# Patient Record
Sex: Female | Born: 1985 | Race: Black or African American | Hispanic: No | Marital: Single | Smoking: Former smoker
Health system: Southern US, Community
[De-identification: ages and names within clinical notes are randomized; demographics above are authoritative.]

## PROBLEM LIST (undated history)

## (undated) DIAGNOSIS — Z8709 Personal history of other diseases of the respiratory system: Secondary | ICD-10-CM

## (undated) HISTORY — DX: Personal history of other diseases of the respiratory system: Z87.09

---
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
## 2006-05-01 ENCOUNTER — Emergency Department (HOSPITAL_COMMUNITY): Admission: EM | Admit: 2006-05-01 | Discharge: 2006-05-01 | Payer: Self-pay | Admitting: Family Medicine

## 2006-05-04 ENCOUNTER — Emergency Department (HOSPITAL_COMMUNITY): Admission: EM | Admit: 2006-05-04 | Discharge: 2006-05-04 | Payer: Self-pay | Admitting: Emergency Medicine

## 2006-05-21 ENCOUNTER — Emergency Department (HOSPITAL_COMMUNITY): Admission: EM | Admit: 2006-05-21 | Discharge: 2006-05-21 | Payer: Self-pay | Admitting: Family Medicine

## 2007-04-09 ENCOUNTER — Emergency Department (HOSPITAL_COMMUNITY): Admission: EM | Admit: 2007-04-09 | Discharge: 2007-04-09 | Payer: Self-pay | Admitting: Emergency Medicine

## 2007-07-09 ENCOUNTER — Emergency Department (HOSPITAL_COMMUNITY): Admission: EM | Admit: 2007-07-09 | Discharge: 2007-07-09 | Payer: Self-pay | Admitting: Emergency Medicine

## 2009-06-28 ENCOUNTER — Inpatient Hospital Stay: Payer: Self-pay | Admitting: General Surgery

## 2010-07-09 ENCOUNTER — Encounter: Admission: RE | Admit: 2010-07-09 | Discharge: 2010-07-09 | Payer: Self-pay | Admitting: Family Medicine

## 2011-04-25 NOTE — Discharge Summary (Signed)
[place]. [facility]  Patient:    Kristen Galvan, Kristen Galvan                   MRN: [id number] Adm. Date:  03/24/00 Disc. Date: 03/27/00 Attending:  Alleen Borne, M.D. Dictator:   Sherrie George, P.A. CC:         Lorelle Formosa, M.D.                           Discharge Summary  DATE OF BIRTH:  [birth year].  ADMISSION DIAGNOSES: 1. Spontaneous left pneumothorax. 2. Pneumomediastinum.  DISCHARGE DIAGNOSES: 1. Spontaneous left pneumothorax. 2. Pneumomediastinum.  PROCEDURES:   Insertion of left chest tube 03/24/00, Dr. Laneta Simmers.  BRIEF HISTORY:  Patient is a 25 year old black female who has otherwise been in good health.  She developed some shortness of breath and pain in the central chest and into the throat.  The family brought her to the emergency room and a chest x-ray was obtained and showed a left pneumothorax and pneumomediastinum with a slight shift of the heart to the right.  Breath sounds were diminished on the right and there was some bilateral wheezing.  A 20-French chest tube was inserted without difficulty and the patient was admitted for observation and further treatment as indicated.  ALLERGIES:  None known.  CURRENT MEDICATIONS:  None.  PAST MEDICAL HISTORY:  Significant for asthma which she has treated p.r.n. with inhalers ______ .  Her last use was approximately a year ago, she has had no other surgical or medical history.  For further history and physical, please see the dictated note.  HOSPITAL COURSE:  After chest tube insertion, she was transferred to the pediatric floor.  She was seen by pediatric service, who has followed her throughout her hospital course.  On April 17, she looked more comfortable after the chest tube was in, there was no leak ______ subcutaneous air. Repeat chest x-ray on 4/18 showed a loculated anterior pneumothorax on the left.  It was smaller than yesterday, but the chest tube was left in place  at waterseal in order to allow more opportunity to drain.  By March 26, 2000, there was still no air leak, there was significant change in the chest x-ray it might have been somewhat smaller in the chest, the pneumomediastinum might have been somewhat smaller.  The chest tube was subsequently removed.  On March 27, 2000, her chest x-ray showed the lung to be inflated, everything looked good and it was Dr. Garen Grams opinion that she could be discharged home.  DISCHARGE MEDICATIONS:  Darvocet-N 100 one to two p.o. q.4h. p.r.n.  FOLLOWUP:  She will return on Tuesday, May 1 with a chest x-ray at the [facility] and see Dr. Laneta Simmers at 9:45 a.m.  The patient is instructed to call if she has any problems, to clean her wounds with plenty of soap and water.  The family was also instructed that this could occur again on either side and if she had any further symptoms like this to come back to the emergency room and obtain a chest x-ray.  LABORATORY DATA:  None.  DISCHARGE CONDITION:  Improving. DD:  03/27/00 TD:  03/28/00 Job: [id number]

## 2011-04-25 NOTE — H&P (Signed)
[place]. [facility]  Patient:    Kristen Galvan, Kristen Galvan                  MRN: [id number] Adm. Date:  03/24/00 Attending:  Alleen Borne, M.D. CC:         Alleen Borne, M.D.                         History and Physical  DATE OF BIRTH:  Aug 08, 1986  CHIEF COMPLAINT:  Shortness of breath.  HISTORY OF PRESENT ILLNESS:  This is a 25 year old female with asthma who began developing upper respiratory symptoms on March 23, 2000 and then overnight developed shortness of breath, wheezing, and pain in lateral chest up into the throat.  She presented to the emergency room and there chest x-rays were done which showed left pneumothorax and pneumomediastinum with slight shift of the heart to the right.  On examination she had decreased breath sounds on the left with wheezing bilaterally.  After consent was received a 24 inch left chest tube was inserted without difficulty by Dr. Laneta Simmers.  Follow-up x-ray revealed a resolution of the left pneumothorax but there was the persistence of a pneumomediastinum.  Patient was subsequently admitted to the hospital and pediatric consult was obtained.  PAST MEDICAL HISTORY:  Significant for asthma for which she is treated with p.r.n. inhalers in third and fifth grade.  Her last use was approximately a year ago.  She had no other surgical or medical history.  MEDICATIONS:  None.  ALLERGIES:  No known drug allergies.  SOCIAL HISTORY:  She has one older sibling.  She lives with her parents.  She is currently in the eighth grade.  FAMILY HISTORY:  Negative for asthma.  PHYSICAL EXAMINATION:  GENERAL:  Reveals a well-appearing 25 year old female resting comfortably in no acute distress.  A&O x 3.  Judgment, insight appropriate for age.  VITAL SIGNS:  Temperature 99.9.  Heart rate 14.  Respiratory rate 45.  Blood pressure 126/68.  Saturation 99% on room air.  HEENT:  , AT, EOMI, PERRL.  Oropharynx is clear.  TMs are  clear.  Tonsils are benign.  NECK:  Supple without JVD, lymphadenopathy, thyromegaly.  No carotid bruits are noted.  Trachea is in the midline.  No crepitus noted.  CHEST:  Symmetrical inspiration.  There is noted scattered wheezes throughout with increased extension noted.  There is no nasal flaring but there is mild retraction noted.  There is chest tube placement on the left side of the chest.  CARDIOVASCULAR:  Regular rate and rhythm without murmur, rub, or gallop.  PMI is nondisplaced.  No ______.  ABDOMEN:  Soft.  Bowel sounds present in four quadrants.  Nontender.  No palpable pulses, ______.  No bruits.  GENITOURINARY:  Deferred.  RECTAL:  Deferred.  EXTREMITIES:  Without clubbing, cyanosis, or edema.  Peripheral pulses intact.  NEUROLOGIC:  CN II-XII grossly intact without focal deficits.  IMPRESSION: 1. Spontaneous pneumothorax. 2. Pneumomediastinum.  PLAN:  As noted, patient is admitted and will be treated and pediatric consult gotten. DD:  03/24/00 TD:  03/24/00 Job: [id number]

## 2011-09-22 LAB — WET PREP, GENITAL: Yeast Wet Prep HPF POC: NONE SEEN

## 2011-09-22 LAB — GC/CHLAMYDIA PROBE AMP, GENITAL
Chlamydia, DNA Probe: NEGATIVE
GC Probe Amp, Genital: NEGATIVE

## 2011-09-22 LAB — POCT PREGNANCY, URINE
Operator id: 247071
Preg Test, Ur: NEGATIVE

## 2011-09-22 LAB — POCT URINALYSIS DIP (DEVICE)
Bilirubin Urine: NEGATIVE
Glucose, UA: NEGATIVE
Hgb urine dipstick: NEGATIVE
Operator id: 247071
Specific Gravity, Urine: 1.02

## 2011-12-09 HISTORY — PX: APPENDECTOMY: SHX54

## 2012-06-25 ENCOUNTER — Ambulatory Visit: Payer: Self-pay | Admitting: Emergency Medicine

## 2013-03-23 ENCOUNTER — Ambulatory Visit: Payer: Self-pay | Admitting: Family Medicine

## 2013-11-13 ENCOUNTER — Observation Stay: Payer: Self-pay

## 2013-11-14 ENCOUNTER — Inpatient Hospital Stay: Payer: Self-pay

## 2013-11-14 LAB — CBC WITH DIFFERENTIAL/PLATELET
Basophil %: 0.4 %
Eosinophil #: 0.1 10*3/uL (ref 0.0–0.7)
HCT: 36.6 % (ref 35.0–47.0)
HGB: 12.3 g/dL (ref 12.0–16.0)
Lymphocyte #: 1.7 10*3/uL (ref 1.0–3.6)
MCH: 30.5 pg (ref 26.0–34.0)
MCHC: 33.7 g/dL (ref 32.0–36.0)
Monocyte #: 0.8 x10 3/mm (ref 0.2–0.9)
Neutrophil #: 7.9 10*3/uL — ABNORMAL HIGH (ref 1.4–6.5)
RDW: 15 % — ABNORMAL HIGH (ref 11.5–14.5)
WBC: 10.5 10*3/uL (ref 3.6–11.0)

## 2014-03-13 ENCOUNTER — Ambulatory Visit: Payer: Self-pay | Admitting: Physician Assistant

## 2014-03-19 LAB — WOUND CULTURE

## 2015-03-30 NOTE — Op Note (Signed)
PATIENT NAME:  Kristen Galvan, Kristen Galvan MR#:  [id number] DATE OF BIRTH:  [birth year]  DATE OF PROCEDURE:  11/14/2013  PREOPERATIVE DIAGNOSES: 1. Intrauterine pregnancy at 37 weeks, 4 days gestational age.  2. Spontaneous labor.  3. Fetal intolerance of labor.   POSTOPERATIVE DIAGNOSES: 1. Intrauterine pregnancy at 37 weeks, 4 days gestational age.  2. Spontaneous labor.  3. Fetal intolerance of labor.   PROCEDURE: Primary low transverse cesarean section via Pfannenstiel incision.   ANESTHESIA: Epidural.   SURGEON: Thomasene MohairStephen Zyion Leidner, M.D.   ASSISTANT: Jill Sideolleen L. Sharen HonesGutierrez, CNM   ESTIMATED BLOOD LOSS: 1000 mL.  OPERATIVE FLUIDS: 1000 mL crystalloid.   COMPLICATIONS: None.   FINDINGS: 1. Gravid uterus with posterior left lower uterine segment fibroid (approximately 5 cm).  2. Gravid appearing fallopian tubes and ovaries.   SPECIMENS: None.   CONDITION AT THE END OF PROCEDURE: Stable.   INDICATIONS FOR PROCEDURE: The patient is a 29 year old gravida 1, para 0 at 937 weeks 4 days  gestational age who arrived in labor and delivery in spontaneous labor. She progressed to approximately 8 cm when she began having late decelerations. This was after augmentation with a minimal amount of Pitocin. Once the augmentation was stopped she continued to have spontaneous contractions; however, with each contraction she would have a late deceleration. She was given terbutaline to see if this would affect decelerations, which with the cessation of contractions it did. The fetal heart rate was also tachycardic to 165 to 170 with minimal variability. Given her lack of response to other in utero resuscitative measures including positional changes, administration of oxygen, as well as a bolus of IV fluid and amnioinfusion. These measures were undertaken without relief of variable decelerations and fetal distress, she was therefore taken to the Operating Room for abdominal delivery.   PROCEDURE IN DETAIL: The  patient was taken to the Operating Room where epidural anesthesia was administered and found to be adequate. She was prepped and draped in the usual sterile fashion after being positioned in the dorsal supine position with a rightward tilt. After timeout was called, a Pfannenstiel incision was made with a scalpel and carried through the various layers until the peritoneum was identified and entered sharply. After extending the peritoneal opening, a bladder flap was created and a bladder retractor was used to move the bladder out of operative area of interest. A low transverse hysterotomy was made with a scalpel and extended laterally with cranial and caudal tension. The fetal vertex was grasped, elevated to the hysterotomy and delivered with fundal pressure. The head followed by the shoulders and the rest of the body were delivered without difficulty. The cord was clamped and cut and the infant was handed to the pediatrician. Cord blood was collected. The placenta was removed and the uterus was exteriorized and cleared of all clots and debris. The hysterotomy was closed using #0 Vicryl in a running locked fashion. A second layer of the same suture was used to obtain hemostasis. Uterus was returned to the abdomen and gutters were cleared of all clots and debris. The peritoneum was closed using #0 Vicryl in a running fashion.   The On-Q catheters were placed according to the manufacturer's recommendations. They were placed approximately 4 cm cephalad to the incision line, inserted to a depth of approximately the fourth marking on the catheters, positioned just superficial to the rectus abdominis muscles and just deep to the rectus fascia. They were located about 1 cm apart straddling the midline. Each catheter was bolused with  5 mL of 0.5% Marcaine plain after skin closure. This was for a total of 10 mL of 0.5% Marcaine plain.   The fascia was reapproximated using #0 Maxon using 2 stitches, each starting at the  lateral apex and meeting in the midline where they were tied together. Skin was closed using subcuticular stapler and this closure was reinforced using benzoin as well as Steri-Strips.   The On-Q catheters were affixed to the skin using Dermabond as well as Steri-Strips and Tegaderm.   The patient tolerated the procedure well. Sponge, lap, and needle counts were correct x 2. The patient was wearing pneumatic compression stockings throughout the entire procedure. She was given 1 gram of Ancef for antibiotic prophylaxis prior to skin incision.   ____________________________ Conard Novak, MD sdj:sg D: 11/14/2013 13:45:31 ET T: 11/14/2013 14:19:41 ET JOB#: [id number]  cc: Conard Novak, MD, <Dictator> Conard Novak MD ELECTRONICALLY SIGNED 12/06/2013 7:54

## 2015-04-17 NOTE — H&P (Signed)
L&D Evaluation:  History Expanded:  HPI 29 yo G2 P0010, EDD of 12/01/13 per LMP and 13 wk US. Presents at 37 4/7 wks with c/o increased ctx. Pt was evaluated yesterday for same, dilated 1 cm with ctx q 10 min, she was given Morphine for therapeutic rest before d/c home. C/o LOF after admission with negative nitrizine. No VB or decrease FM. [facility], tx care from [facility] at 13 wks, elevated 1 hr with normal 3 hr. 5 cm posterior fibroid seen at 20 weeks.   Blood Type (Maternal) O positive   Group B Strep Results Maternal (Result >5wks must be treated as unknown) negative   Maternal HIV Negative   Maternal Syphilis Ab Nonreactive   Maternal Varicella Immune   Rubella Results (Maternal) immune   Maternal T-Dap Immune   Presents with contractions   Patient's Medical History spontaneous pneumothorax at age 29   Patient's Surgical History Appendectomy   Medications Pre [place]atal Vitamins   Allergies PCN   Social History none   ROS:  ROS see HPI   Exam:  Vital Signs stable  initial temp 99.1, now 98.6   General no apparent distress   Mental Status clear   Chest clear   Heart no murmur/gallop/rubs   Abdomen gravid, tender with contractions   Estimated Fetal Weight 6.5 lbs per [place]   Fetal Position vertex   Edema no edema   Pelvic 4.5 per RN   Mebranes questionable SROM - negative nitrizine   FHT baseline 150-160, min-mod variability, small accelerations   Ucx regular, q 9-10 min   Impression:  Impression active labor   Plan:  Comments Admission for labor Requesting epidural   Electronic Signatures: Uzair Godley, Marta Lamasamara K (CNM)  (Signed 08-Dec-14 06:31)  Authored: L&D Evaluation   Last Updated: 08-Dec-14 06:31 by Vella KohlerBrothers, Mackenzee Becvar K (CNM)

## 2015-04-17 NOTE — H&P (Signed)
L&D Evaluation:  History Expanded:  HPI 29 yo G2 P0010, EDD of 12/01/13 per LMP and 13 wk US. Presents with c/o ctx since last night, about every 10 minutes. Denies LOF, VB or decrease FM. [facility], tx care from [facility] at 13 wks, elevated 1 hr with normal 3 hr.   Blood Type (Maternal) O positive   Group B Strep Results Maternal (Result >5wks must be treated as unknown) negative   Maternal HIV Negative   Maternal Syphilis Ab Nonreactive   Maternal Varicella Immune   Rubella Results (Maternal) immune   Patient's Medical History spontaneous pneumothorax at age 29   Patient's Surgical History Appendectomy   Medications Pre [place]atal Vitamins   Allergies PCN   Social History none   ROS:  ROS see HPI   Exam:  Vital Signs stable   General no apparent distress   Mental Status clear   Chest no increased work of breathing   Abdomen gravid, tender with contractions   Fetal Position vertex   Edema no edema   Pelvic 1/90/-2, BBOW   Mebranes Intact   FHT normal rate with no decels, initial FHR with decreased variability, became category 1 with po hydration and food intake (pt had not eaten before coming in this am)   Ucx regular, q 9-10 min   Impression:  Impression early labor   Plan:  Comments Discussed early labor with pt and family. Offered therapeutic rest which pt agrees to - Morphine 4 mg IM.   Discharge home with term labor precautions.   Follow Up Appointment already scheduled. 12/8   Electronic Signatures: Marta AntuBrothers, Gladyes Kudo K (CNM)  (Signed 07-Dec-14 12:00)  Authored: L&D Evaluation   Last Updated: 07-Dec-14 12:00 by Vella KohlerBrothers, Payam Gribble K (CNM)

## 2016-01-23 ENCOUNTER — Other Ambulatory Visit: Payer: Self-pay | Admitting: Nurse Practitioner

## 2016-01-23 DIAGNOSIS — N63 Unspecified lump in unspecified breast: Secondary | ICD-10-CM

## 2016-02-01 ENCOUNTER — Ambulatory Visit
Admission: RE | Admit: 2016-02-01 | Discharge: 2016-02-01 | Disposition: A | Discharge status: Home / Self Care | Payer: Medicaid Other | Source: Ambulatory Visit | Attending: Nurse Practitioner | Admitting: Nurse Practitioner

## 2016-02-01 ENCOUNTER — Other Ambulatory Visit: Payer: Self-pay | Admitting: Nurse Practitioner

## 2016-02-01 DIAGNOSIS — N63 Unspecified lump in unspecified breast: Secondary | ICD-10-CM

## 2016-02-04 ENCOUNTER — Other Ambulatory Visit: Payer: Self-pay | Admitting: Nurse Practitioner

## 2016-02-04 DIAGNOSIS — N63 Unspecified lump in unspecified breast: Secondary | ICD-10-CM

## 2016-02-07 ENCOUNTER — Ambulatory Visit (INDEPENDENT_AMBULATORY_CARE_PROVIDER_SITE_OTHER): Payer: Medicaid Other | Admitting: Family Medicine

## 2016-02-07 ENCOUNTER — Encounter: Payer: Self-pay | Admitting: Family Medicine

## 2016-02-07 VITALS — BP 105/69 | HR 85 | Ht 66.0 in | Wt 122.6 lb

## 2016-02-07 DIAGNOSIS — B349 Viral infection, unspecified: Secondary | ICD-10-CM | POA: Diagnosis not present

## 2016-02-07 MED ORDER — NAPROXEN 500 MG PO TABS: 500.0000 mg | ORAL_TABLET | Freq: Two times a day (BID) | ORAL | Status: DC

## 2016-02-07 NOTE — Progress Notes (Signed)
Date:  02/07/2016   Name:  Kristen Galvan   DOB:  1986-09-19   MRN:  [id number]  PCP:  No PCP Per Patient    Chief Complaint: New Evaluation and Back Pain   History of Present Illness:  This is a 30 y.o. female with 2d hx headache, upper and lower non-radiating back pain, subjective fever and chills. No URI or abdominal sxs. Ibuprofen helps but wears off. Otherwise healthy.  Review of Systems:  Review of Systems  HENT: Negative for ear pain, rhinorrhea and sore throat.   Respiratory: Negative for cough.   Genitourinary: Negative for dysuria.  Neurological: Negative for dizziness and light-headedness.    There are no active problems to display for this patient.   Prior to Admission medications   Medication Sig Start Date End Date Taking? Authorizing Provider  naproxen (NAPROSYN) 500 MG tablet Take 1 tablet (500 mg total) by mouth 2 (two) times daily with a meal. 02/07/16   Schuyler Amor, MD    No Known Allergies  Past Surgical History  Procedure Laterality Date  . Appendectomy  2013  . Lung surgery Left 1999    Social History  Substance Use Topics  . Smoking status: Never Smoker   . Smokeless tobacco: None  . Alcohol Use: No    Family History  Problem Relation Age of Onset  . Diabetes Father     Medication list has been reviewed and updated.  Physical Examination: BP 105/69 mmHg  Pulse 85  Ht  (1.676 m)  Wt 122 lb 9.6 oz (55.611 kg)  BMI 19.80 kg/m2  LMP 01/02/2016  Physical Exam  Constitutional: She is oriented to person, place, and time. She appears well-developed and well-nourished.  HENT:  Head: Normocephalic and atraumatic.  Right Ear: External ear normal.  Left Ear: External ear normal.  Nose: Nose normal.  Mouth/Throat: Oropharynx is clear and moist.  TM's clear  Eyes: Conjunctivae and EOM are normal. Pupils are equal, round, and reactive to light. No scleral icterus.  Neck: Neck supple. No thyromegaly present.  Cardiovascular:  Normal rate, regular rhythm and normal heart sounds.   Pulmonary/Chest: Effort normal and breath sounds normal.  Abdominal: Soft. She exhibits no distension and no mass. There is no tenderness.  Musculoskeletal: She exhibits no edema.  Mild diffuse parathoracic and paralumbar tenderness  Lymphadenopathy:    She has no cervical adenopathy.  Neurological: She is alert and oriented to person, place, and time. Coordination normal.  Skin: Skin is warm and dry.  Psychiatric: She has a normal mood and affect. Her behavior is normal.  Nursing note and vitals reviewed.   Assessment and Plan:  1. Viral syndrome Naprosyn 500 mg bid x 1 week  Return if symptoms worsen or fail to improve.  Dionne Ano. Kingsley Spittle MD [facility]  02/07/2016

## 2016-02-13 ENCOUNTER — Ambulatory Visit
Admission: RE | Admit: 2016-02-13 | Discharge: 2016-02-13 | Disposition: A | Discharge status: Home / Self Care | Payer: Medicaid Other | Source: Ambulatory Visit | Attending: Nurse Practitioner | Admitting: Nurse Practitioner

## 2016-02-13 DIAGNOSIS — N6009 Solitary cyst of unspecified breast: Secondary | ICD-10-CM | POA: Diagnosis not present

## 2016-02-13 DIAGNOSIS — N63 Unspecified lump in unspecified breast: Secondary | ICD-10-CM

## 2016-02-13 HISTORY — PX: BREAST BIOPSY: SHX20

## 2016-02-15 LAB — SURGICAL PATHOLOGY

## 2016-02-16 ENCOUNTER — Encounter: Payer: Self-pay | Admitting: Emergency Medicine

## 2016-02-16 ENCOUNTER — Emergency Department: Payer: Medicaid Other

## 2016-02-16 ENCOUNTER — Emergency Department
Admission: EM | Admit: 2016-02-16 | Discharge: 2016-02-17 | Disposition: A | Discharge status: Home / Self Care | Payer: Medicaid Other | Attending: Emergency Medicine | Admitting: Emergency Medicine

## 2016-02-16 DIAGNOSIS — Z791 Long term (current) use of non-steroidal anti-inflammatories (NSAID): Secondary | ICD-10-CM | POA: Diagnosis not present

## 2016-02-16 DIAGNOSIS — J45909 Unspecified asthma, uncomplicated: Secondary | ICD-10-CM | POA: Diagnosis not present

## 2016-02-16 DIAGNOSIS — J069 Acute upper respiratory infection, unspecified: Secondary | ICD-10-CM | POA: Insufficient documentation

## 2016-02-16 DIAGNOSIS — Z3202 Encounter for pregnancy test, result negative: Secondary | ICD-10-CM | POA: Diagnosis not present

## 2016-02-16 DIAGNOSIS — B349 Viral infection, unspecified: Secondary | ICD-10-CM

## 2016-02-16 DIAGNOSIS — M545 Low back pain, unspecified: Secondary | ICD-10-CM

## 2016-02-16 LAB — CBC
HEMATOCRIT: 40 % (ref 35.0–47.0)
HEMOGLOBIN: 13.3 g/dL (ref 12.0–16.0)
MCH: 29.2 pg (ref 26.0–34.0)
MCHC: 33.3 g/dL (ref 32.0–36.0)
MCV: 87.9 fL (ref 80.0–100.0)
Platelets: 176 10*3/uL (ref 150–440)
RBC: 4.56 MIL/uL (ref 3.80–5.20)
RDW: 13.3 % (ref 11.5–14.5)
WBC: 7.2 10*3/uL (ref 3.6–11.0)

## 2016-02-16 LAB — COMPREHENSIVE METABOLIC PANEL
ALK PHOS: 60 U/L (ref 38–126)
ALT: 12 U/L — AB (ref 14–54)
ANION GAP: 3 — AB (ref 5–15)
AST: 18 U/L (ref 15–41)
Albumin: 4.4 g/dL (ref 3.5–5.0)
BUN: 13 mg/dL (ref 6–20)
CALCIUM: 8.6 mg/dL — AB (ref 8.9–10.3)
CHLORIDE: 107 mmol/L (ref 101–111)
CO2: 22 mmol/L (ref 22–32)
CREATININE: 0.74 mg/dL (ref 0.44–1.00)
Glucose, Bld: 106 mg/dL — ABNORMAL HIGH (ref 65–99)
Potassium: 3.8 mmol/L (ref 3.5–5.1)
Sodium: 132 mmol/L — ABNORMAL LOW (ref 135–145)
Total Bilirubin: 0.9 mg/dL (ref 0.3–1.2)
Total Protein: 7.6 g/dL (ref 6.5–8.1)

## 2016-02-16 LAB — URINALYSIS COMPLETE WITH MICROSCOPIC (ARMC ONLY)
Bilirubin Urine: NEGATIVE
Glucose, UA: NEGATIVE mg/dL
HGB URINE DIPSTICK: NEGATIVE
NITRITE: NEGATIVE
PROTEIN: NEGATIVE mg/dL
SPECIFIC GRAVITY, URINE: 1.024 (ref 1.005–1.030)
pH: 5 (ref 5.0–8.0)

## 2016-02-16 LAB — RAPID INFLUENZA A&B ANTIGENS: Influenza A (ARMC): NEGATIVE

## 2016-02-16 LAB — SEDIMENTATION RATE: Sed Rate: 6 mm/hr (ref 0–20)

## 2016-02-16 LAB — RAPID INFLUENZA A&B ANTIGENS (ARMC ONLY): INFLUENZA B (ARMC): NEGATIVE

## 2016-02-16 LAB — POCT PREGNANCY, URINE: Preg Test, Ur: NEGATIVE

## 2016-02-16 MED ORDER — HYDROCODONE-ACETAMINOPHEN 5-325 MG PO TABS
1.0000 | ORAL_TABLET | ORAL | Status: AC
  Administered 2016-02-16: 1 via ORAL
  Filled 2016-02-16: qty 1

## 2016-02-16 MED ORDER — HYDROCODONE-ACETAMINOPHEN 5-325 MG PO TABS: 1.0000 | ORAL_TABLET | Freq: Four times a day (QID) | ORAL | Status: DC | PRN

## 2016-02-16 MED ORDER — IBUPROFEN 600 MG PO TABS
600.0000 mg | ORAL_TABLET | ORAL | Status: AC
  Administered 2016-02-16: 600 mg via ORAL
  Filled 2016-02-16: qty 1

## 2016-02-16 MED ORDER — ACETAMINOPHEN 500 MG PO TABS
1000.0000 mg | ORAL_TABLET | Freq: Once | ORAL | Status: AC
  Administered 2016-02-16: 1000 mg via ORAL
  Filled 2016-02-16: qty 2

## 2016-02-16 NOTE — ED Provider Notes (Signed)
[facility] Emergency Department Provider Note  ____________________________________________  Time seen: Approximately 9:45 PM  I have reviewed the triage vital signs and the nursing notes.   HISTORY  Chief Complaint Back Pain    HPI Kristen Galvan is a 30 y.o. female reports for about the last week she's been having achy pain in her lower back, she also started developing a nonproductive cough about 3 days ago, a runny nose, and feels as though all of her muscles are aching. She denies neck pain or headache.  She does report that her lower back feels very achy. She denies pregnancy. No abdominal pain nausea or vomiting. She is taking naproxen but stopped taking this about 2 days ago. She was seen by her primary care doctor who placed her on naproxen for muscle aches and lower back pain a few days ago.  She denies shortness of breath or wheezing. She does have a history of asthma but denies active symptoms. Nonproductive cough. She did not receive an influenza shot.  No numbness tingling or weakness in the legs. No history of IV drug abuse, cancer.  Patient does report she had an appendectomy, pneumothorax at age 68, and had a breast biopsy just a few days ago over the left breast but denies pain redness or concerning the surgery.  Past Medical History  Diagnosis Date  . History of pneumothorax 1999    There are no active problems to display for this patient.   Past Surgical History  Procedure Laterality Date  . Appendectomy  2013  . Lung surgery Left 1999  . Breast biopsy Left 02/13/2016    path pending    Current Outpatient Rx  Name  Route  Sig  Dispense  Refill  . HYDROcodone-acetaminophen (NORCO/VICODIN) 5-325 MG tablet   Oral   Take 1 tablet by mouth every 6 (six) hours as needed for moderate pain.   15 tablet   0   . naproxen (NAPROSYN) 500 MG tablet   Oral   Take 1 tablet (500 mg total) by mouth 2 (two) times daily with a meal.    14 tablet   0     Allergies Review of patient's allergies indicates no known allergies.  Family History  Problem Relation Age of Onset  . Diabetes Father     Social History Social History  Substance Use Topics  . Smoking status: Never Smoker   . Smokeless tobacco: None  . Alcohol Use: No    Review of Systems Constitutional: Fever and chills  Eyes: No visual changes. ENT: No sore throat. Runny nose Cardiovascular: Denies chest pain. Respiratory: Denies shortness of breath. Slight cough nonproductive. Gastrointestinal: No abdominal pain.  No nausea, no vomiting.  No diarrhea.  No constipation. Genitourinary: Negative for dysuria. No bowel or bladder incontinence. Musculoskeletal: Achy pain across the muscles of her lower back for about the last week. Skin: Negative for rash. Neurological: Negative for headaches, focal weakness or numbness.  10-point ROS otherwise negative.  ____________________________________________   PHYSICAL EXAM:  VITAL SIGNS: ED Triage Vitals  Enc Vitals Group     BP 02/16/16 1900 115/75 mmHg     Pulse Rate 02/16/16 1900 107     Resp 02/16/16 1900 20     Temp 02/16/16 1900 102.5 F (39.2 C)     Temp Source 02/16/16 1900 Oral     SpO2 02/16/16 1900 96 %     Weight 02/16/16 1900 122 lb (55.339 kg)     Height  02/16/16 1900 '5\' 6"'  (1.676 m)     Head Cir --      Peak Flow --      Pain Score 02/16/16 1902 8     Pain Loc --      Pain Edu? --      Excl. in [place]? --    Constitutional: Alert and oriented. Well appearing and in no acute distress. Eyes: Conjunctivae are normal. PERRL. EOMI. Head: Atraumatic.No photophobia. Nose: No congestion/rhinnorhea. Mouth/Throat: Mucous membranes are moist.  Oropharynx non-erythematous. Neck: No stridor.  No meningismus. Cardiovascular: Normal rate, regular rhythm. Grossly normal heart sounds.  Good peripheral circulation. Left breast with a small pinhole size incision that appears to be healing well, no  erythema tenderness redness or swelling noted in the region. Respiratory: Normal respiratory effort.  No retractions. Lungs CTAB. No wheezing. Speaks in full normal sentences with no distress. Gastrointestinal: Soft and nontender. No distention.  No CVA tenderness. Musculoskeletal: No cervical or thoracic tenderness. Patient does report mild bilateral discomfort to palpation of the paraspinous muscles of the lumbar spine. She does also report some mild tenderness over the mid lumbar spine to palpation. She reports it feels "achy". There is no deformity. No lower extremity tenderness nor edema.  No joint effusions. Normal patellar reflexes bilaterally. 5 out of 5 strength in all major muscle groups of the legs with normal gait. No loss of sensation over the legs or thighs. Neurologic:  Normal speech and language. No gross focal neurologic deficits are appreciated. No gait instability. Skin:  Skin is warm, dry and intact. No rash noted. Psychiatric: Mood and affect are normal. Speech and behavior are normal.  ____________________________________________   LABS (all labs ordered are listed, but only abnormal results are displayed)  Labs Reviewed  URINALYSIS COMPLETEWITH MICROSCOPIC ([facility] ONLY) - Abnormal; Notable for the following:    Color, Urine YELLOW (*)    APPearance HAZY (*)    Ketones, ur 1+ (*)    Leukocytes, UA TRACE (*)    Bacteria, UA RARE (*)    Squamous Epithelial / LPF 6-30 (*)    All other components within normal limits  COMPREHENSIVE METABOLIC PANEL - Abnormal; Notable for the following:    Sodium 132 (*)    Glucose, Bld 106 (*)    Calcium 8.6 (*)    ALT 12 (*)    Anion gap 3 (*)    All other components within normal limits  RAPID INFLUENZA A&B ANTIGENS ([facility] ONLY)  CULTURE, BLOOD (ROUTINE X 2)  CULTURE, BLOOD (ROUTINE X 2)  CBC  SEDIMENTATION RATE  INFLUENZA PANEL BY PCR (TYPE A & B, H1N1)  POCT PREGNANCY, URINE    ____________________________________________  EKG   ____________________________________________  RADIOLOGY   DG Chest 2 View (Final result) Result time: 02/16/16 22:00:58   Final result by Rad Results In Interface (02/16/16 22:00:58)   Narrative:   CLINICAL DATA: Cough for 1 week. Fever.  EXAM: CHEST 2 VIEW  COMPARISON: 03/23/2013  FINDINGS: The cardiomediastinal contours are normal. The lungs are clear. Pulmonary vasculature is normal. No consolidation, pleural effusion, or pneumothorax. No acute osseous abnormalities are seen.  IMPRESSION: Normal chest radiographs.   Electronically Signed By: Jeb Levering M.D. On: 02/16/2016 22:00    ____________________________________________   PROCEDURES  Procedure(s) performed: None  Critical Care performed: No  ____________________________________________   INITIAL IMPRESSION / ASSESSMENT AND PLAN / ED COURSE  Pertinent labs & imaging results that were available during my care of the patient were reviewed by  me and considered in my medical decision making (see chart for details).  Patient presents for evaluation of fever, developing a recent nonproductive cough, runny nose for the last 2-3 days with associated muscle aches and lower back pain. Overall her symptoms seemingly most suggestive of a mild viral illness, influenza definitely considered. However, given her back pain was when the initiating symptoms and a fever to 102 I will consider but find it unlikely this would represent epidural abscess. I will send an ESR which is fairly sensitive to screen for this and the relatively low risk patient with no history of IV drug abuse, or immunosuppression. No trauma.  Should her influenza test be positive if things would likely explain most of her symptoms. Urinalysis does not demonstrate clear infection she denies urinary symptoms. No associated chest pain, no red flag neurologic symptoms to go with back  pain such as incontinence, loss of sensation or weakness to lower extremities. Nothing to suggest acute cauda equina.  ----------------------------------------- 10:56 PM on 02/16/2016 -----------------------------------------  Patient influenza test is negative. The patient's symptoms seem to be probably related to something such as a viral type illness, I am concerned that her back pain started first. She does endorse moderate lower back pain in the region of her mid to lower lumbar spine. Given her associated fever, and consideration of possible myelitis, epidural abscess, etc. I have ordered an MRI of the lumbar spine. She does not exhibit any symptoms such as headache, neck stiffness or pain, photophobia or other concerns for meningitis. I believe that if her MRI of the lower back is negative, likely discharge her consideration for probable viral etiology.  Patient awake and alert. Ongoing care and follow-up on MRI of the lumbar spine and PCR flu assigned to Dr. Edd Fabian. ____________________________________________   FINAL CLINICAL IMPRESSION(S) / ED DIAGNOSES  Final diagnoses:  Low back pain  Upper respiratory, viral syndrome  Above diagnoses are provisional, MRI lumbar spine pending.    Delman Kitten, MD 02/16/16 [phone]

## 2016-02-16 NOTE — Discharge Instructions (Signed)
You have been seen in the Emergency Department (ED) today for a likely viral illness.  Please drink plenty of clear fluids (water, Gatorade, chicken broth, etc).  You may use Tylenol and/or Motrin according to label instructions.  You can alternate between the two without any side effects.   Please follow up with your doctor as listed above.  Call your doctor or return to the Emergency Department (ED) if you are unable to tolerate fluids due to vomiting, have worsening trouble breathing, become extremely tired or difficult to awaken, or if you develop any other symptoms that concern you.  ALSO,  You have been seen in the Emergency Department (ED)  today for back pain.  Your workup and exam have not shown any acute abnormalities and you are likely suffering from muscle strain or possible problems with your discs, but there is no treatment that will fix your symptoms at this time.  Please take Motrin (ibuprofen) as needed for your pain according to the instructions written on the box.  Alternatively, for the next five days you can take 600mg  three times daily with meals (it may upset your stomach).  Take hydorocodone as prescribed for severe pain. Do not drink alcohol, drive or participate in any other potentially dangerous activities while taking this medication as it may make you sleepy. Do not take this medication with any other sedating medications, either prescription or over-the-counter. If you were prescribed Percocet or Vicodin, do not take these with acetaminophen (Tylenol) as it is already contained within these medications.   This medication is an opiate (or narcotic) pain medication and can be habit forming.  Use it as little as possible to achieve adequate pain control.  Do not use or use it with extreme caution if you have a history of opiate abuse or dependence.  If you are on a pain contract with your primary care doctor or a pain specialist, be sure to let them know you were prescribed  this medication today from the [facility].  This medication is intended for your use only - do not give any to anyone else and keep it in a secure place where nobody else, especially children, have access to it.  It will also cause or worsen constipation, so you may want to consider taking an over-the-counter stool softener while you are taking this medication.  Please follow up with your doctor as soon as possible regarding today's ED visit and your back pain.  Return to the ED for worsening back pain, fever, weakness or numbness of either leg, or if you develop either (1) an inability to urinate or have bowel movements, or (2) loss of your ability to control your bathroom functions (if you start having "accidents"), or if you develop other new symptoms that concern you.

## 2016-02-16 NOTE — ED Notes (Signed)
Low back pain x 1 week. Denies injury.

## 2016-02-17 LAB — INFLUENZA PANEL BY PCR (TYPE A & B)
H1N1FLUPCR: NOT DETECTED
INFLBPCR: NEGATIVE
Influenza A By PCR: POSITIVE — AB

## 2016-02-17 MED ORDER — GADOBENATE DIMEGLUMINE 529 MG/ML IV SOLN
15.0000 mL | Freq: Once | INTRAVENOUS | Status: AC | PRN
  Administered 2016-02-17: 11 mL via INTRAVENOUS

## 2016-02-17 NOTE — ED Provider Notes (Signed)
-----------------------------------------   12:22 AM on 02/17/2016 ----------------------------------------- Care was assumed from Dr. Fanny BienQuale at approximately 11:30 PM. Briefly this is a 30 year old female presenting with back pain as well as respiratory tract infection symptoms and fever to 102.5. She is awaiting MRI of the lumbar spine. If unremarkable, anticipate discharge home with likely diagnosis of viral syndrome.   ----------------------------------------- 3:48 AM on 02/17/2016 ----------------------------------------- MRI negative. Patient's pain well-controlled at this time and she appears comfortable. Discussed precautions, need for close follow-up and she is comfortable with the discharge plan. DC home.  MRI lumbar spine IMPRESSION: Negative MRI of the lumbar spine with and without contrast.   Kristen DossEryka A Dacey Milberger, MD 02/17/16 [phone]

## 2016-02-18 ENCOUNTER — Encounter: Payer: Self-pay | Admitting: Family Medicine

## 2016-02-18 ENCOUNTER — Ambulatory Visit (INDEPENDENT_AMBULATORY_CARE_PROVIDER_SITE_OTHER): Payer: Medicaid Other | Admitting: Family Medicine

## 2016-02-18 VITALS — BP 122/76 | HR 76 | Ht 66.0 in | Wt 127.2 lb

## 2016-02-18 DIAGNOSIS — M545 Low back pain, unspecified: Secondary | ICD-10-CM

## 2016-02-18 DIAGNOSIS — J101 Influenza due to other identified influenza virus with other respiratory manifestations: Secondary | ICD-10-CM

## 2016-02-18 NOTE — Patient Instructions (Signed)

## 2016-02-19 NOTE — Progress Notes (Signed)
Date:  02/18/2016   Name:  Kristen Galvan   DOB:  [birth year]   MRN:  [id number]  PCP:  No PCP Per Patient    Chief Complaint: Follow-up and Back Pain   History of Present Illness:  This is a 30 y.o. female seen 3/2 for viral syndrome and placed on Naprosyn with some improvement. Developed LBP without radiation 2d ago and seen [facility] ED where MRI negative. CMP/CBC/UA/urine preg negative. One rapid flu recorded as negative but another recorded as positive for flu A. Note indicates had been having cough, rhinorrhea, myalgias for three days. Told to restart Naprosyn. Better today but back still bothering. No flu imm this year.   Review of Systems:  Review of Systems  Constitutional: Negative for fever.  HENT: Negative for sore throat.   Respiratory: Negative for shortness of breath.   Cardiovascular: Negative for chest pain and leg swelling.  Neurological: Negative for syncope and light-headedness.    There are no active problems to display for this patient.   Prior to Admission medications   Medication Sig Start Date End Date Taking? Authorizing Provider  HYDROcodone-acetaminophen (NORCO/VICODIN) 5-325 MG tablet Take 1 tablet by mouth every 6 (six) hours as needed for moderate pain. 02/16/16  Yes Sharyn CreamerMark Quale, MD  naproxen (NAPROSYN) 500 MG tablet Take 1 tablet (500 mg total) by mouth 2 (two) times daily with a meal. 02/07/16  Yes Schuyler AmorWilliam Randie Tallarico, MD    No Known Allergies  Past Surgical History  Procedure Laterality Date  . Appendectomy  2013  . Lung surgery Left 1999  . Breast biopsy Left 02/13/2016    path pending    Social History  Substance Use Topics  . Smoking status: Never Smoker   . Smokeless tobacco: None  . Alcohol Use: No    Family History  Problem Relation Age of Onset  . Diabetes Father     Medication list has been reviewed and updated.  Physical Examination: BP 122/76 mmHg  Pulse 76  Ht 5\' 6"  (1.676 m)  Wt 127 lb 3.2 oz (57.698 kg)  BMI 20.54 kg/m2   LMP 01/02/2016  Physical Exam  Constitutional: She appears well-developed and well-nourished.  HENT:  Mouth/Throat: Oropharynx is clear and moist.  Pulmonary/Chest: Effort normal and breath sounds normal.  Musculoskeletal: She exhibits no edema.  Mild-moderate B paralumbar tenderness  Lymphadenopathy:    She has no cervical adenopathy.  Neurological: She is alert.  Skin: Skin is warm and dry.  Psychiatric: She has a normal mood and affect. Her behavior is normal.  Nursing note and vitals reviewed.   Assessment and Plan:  1. Influenza A Dx unclear given conflicting rapid tests but sxs consistent with influenza, given duration of sxs will not rx with antiviral  2. Bilateral low back pain without sciatica Likely due to viral illness, cont Naprosyn bid   Return if symptoms worsen or fail to improve.  Dionne AnoWilliam M. Kingsley SpittlePlonk, Jr. MD [facility]  02/19/2016

## 2016-02-22 LAB — CULTURE, BLOOD (ROUTINE X 2)
CULTURE: NO GROWTH
CULTURE: NO GROWTH

## 2017-03-18 IMAGING — MR MR LUMBAR SPINE WO/W CM
6 of 7 series · 34 of 48 positions shown · IV contrast (multihance)
Comparison: None.

CLINICAL DATA: Low back pain and LEFT leg pain for 1 week. No
injury. URI symptoms for 3 days. Recent LEFT breast biopsy.

EXAM:
MRI LUMBAR SPINE WITHOUT AND WITH CONTRAST
TECHNIQUE: Multiplanar and multiecho pulse sequences of the lumbar spine were
obtained without and with intravenous contrast.
CONTRAST:  11mL MULTIHANCE GADOBENATE DIMEGLUMINE 529 MG/ML IV SOLN

[Series 3: T2 · sagittal · 4.0mm · 0.81mm/px · 3 of 15 slices shown (1 of 2)]
[im 1/15]
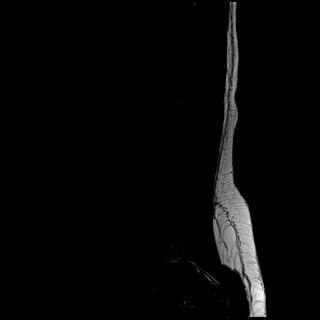
[im 8/15]
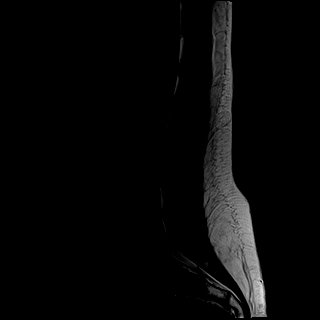
[im 15/15]
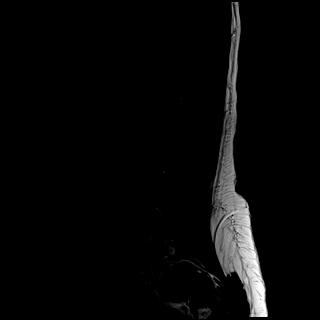

[Series 4: T1 · sagittal · 4.0mm · 0.81mm/px · 4 of 15 slices shown (1 of 2)]
[im 1/15]
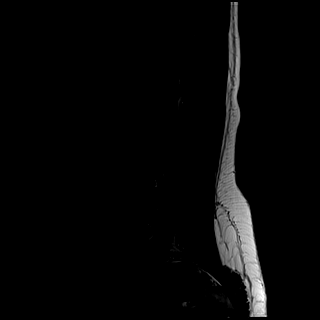
[im 5/15]
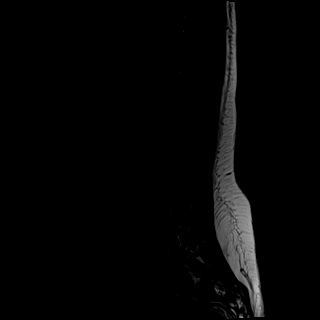
[im 10/15]
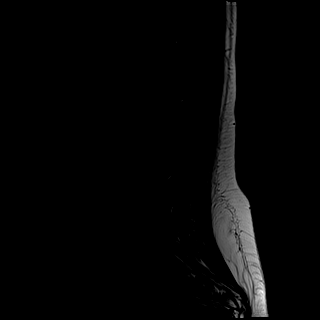
[im 15/15]
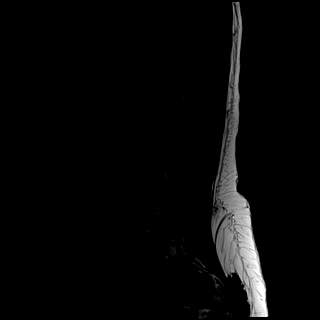

[Series 5: STIR · sagittal · 4.0mm · 1.02mm/px · 1 of 15 slices shown]
[im 1/15]
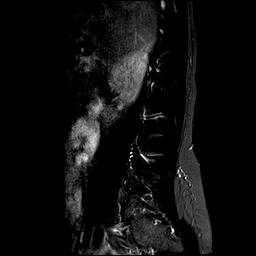

[Series 6: T2 · axial · 4.0mm · 0.78mm/px · z∈[-90,+162]mm · 11 of 43 slices shown (2 of 2)]
[im 1/43]
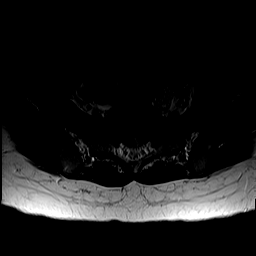
[im 5/43]
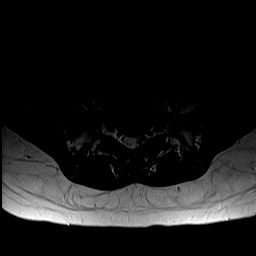
[im 9/43]
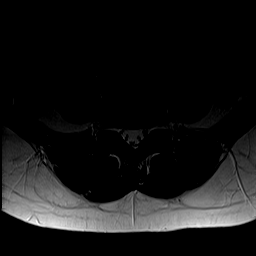
[im 13/43]
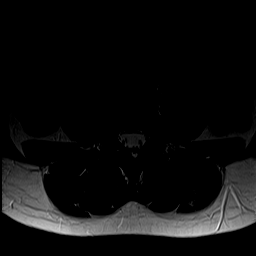
[im 17/43]
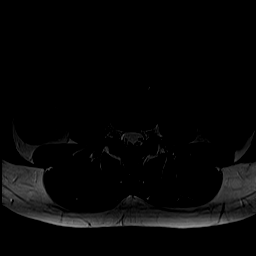
[im 22/43]
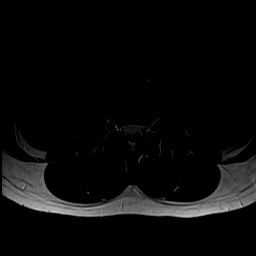
[im 26/43]
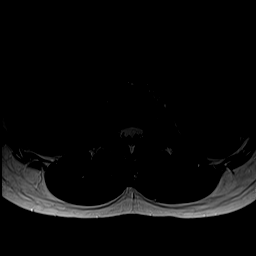
[im 30/43]
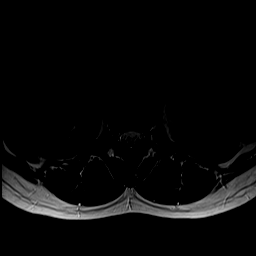
[im 34/43]
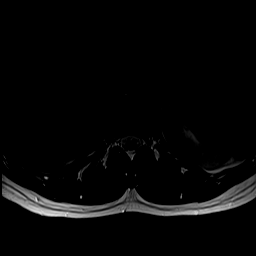
[im 38/43]
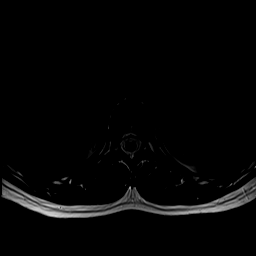
[im 43/43]
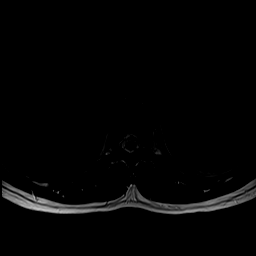

[Series 7: T1 · axial · 4.0mm · 0.39mm/px · z∈[-90,+162]mm · 11 of 43 slices shown (2 of 2)]
[im 1/43]
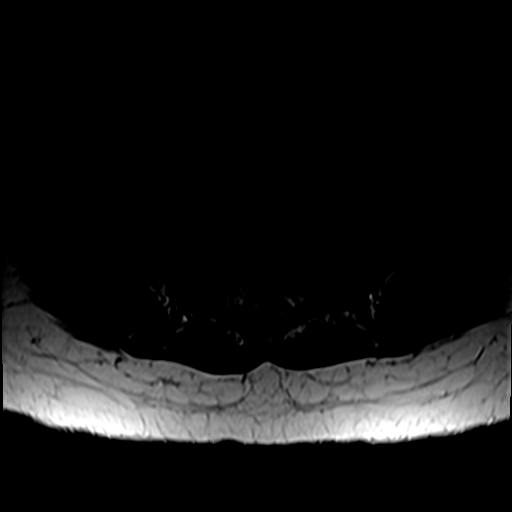
[im 5/43]
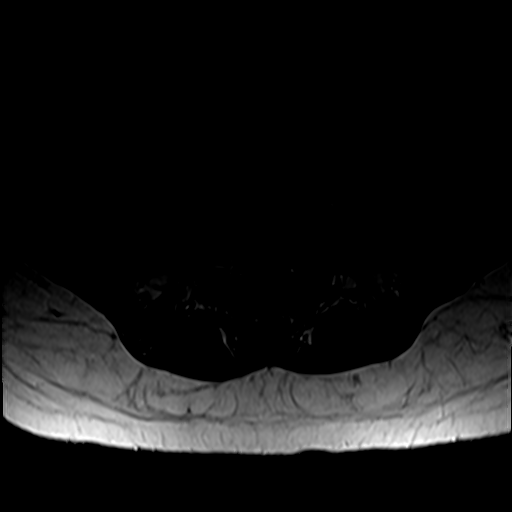
[im 9/43]
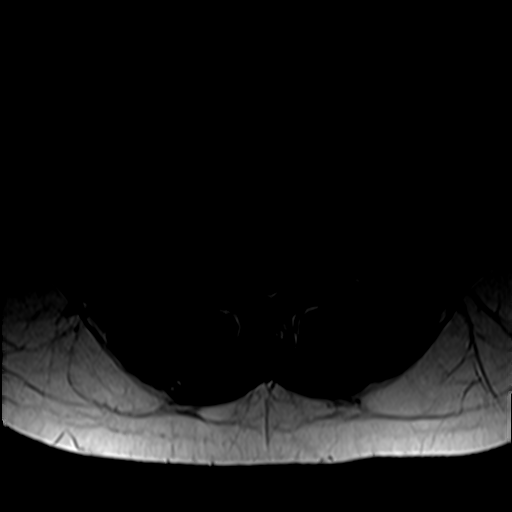
[im 13/43]
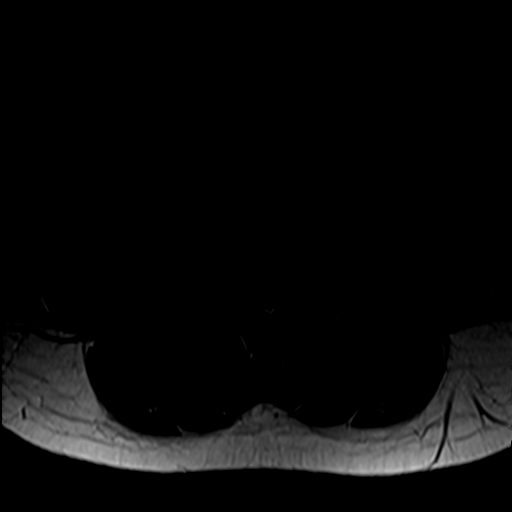
[im 17/43]
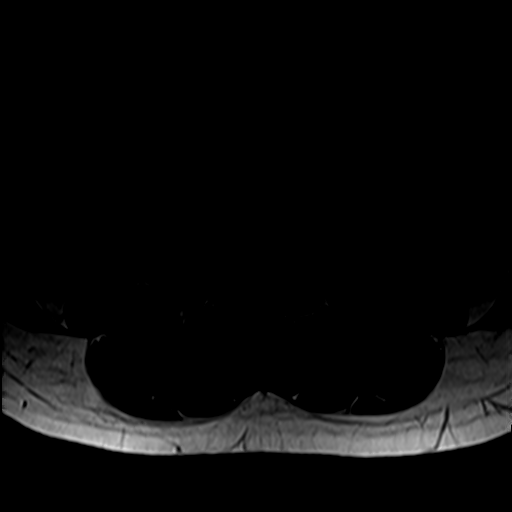
[im 22/43]
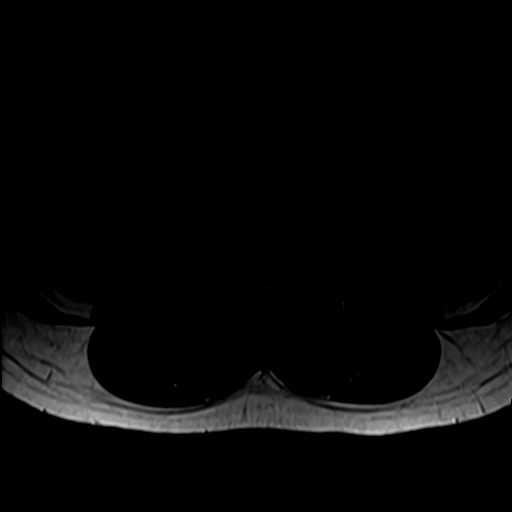
[im 26/43]
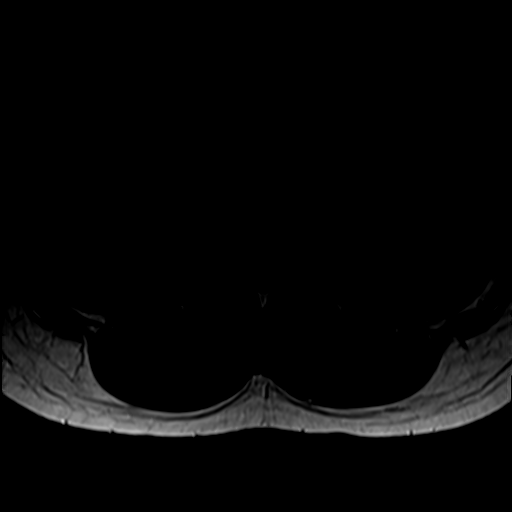
[im 30/43]
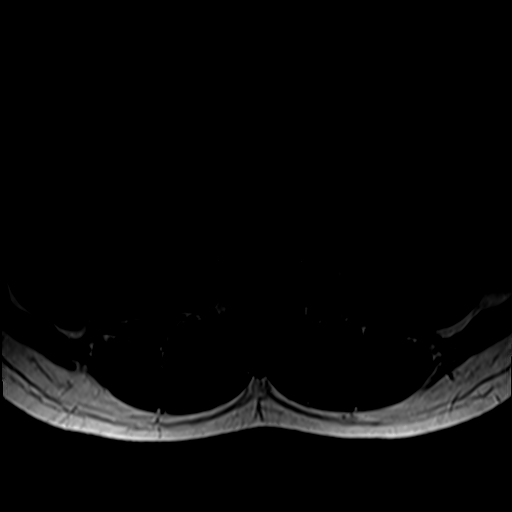
[im 34/43]
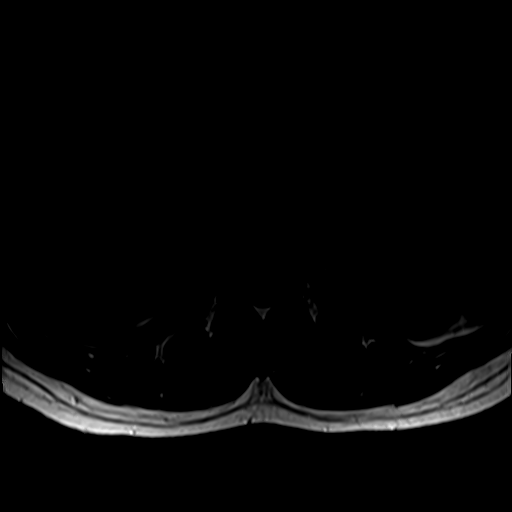
[im 38/43]
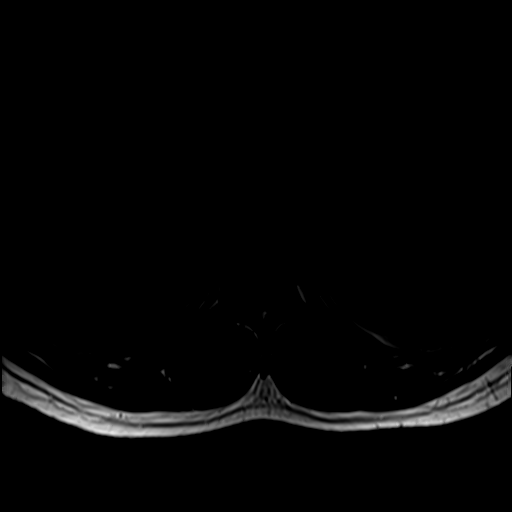
[im 43/43]
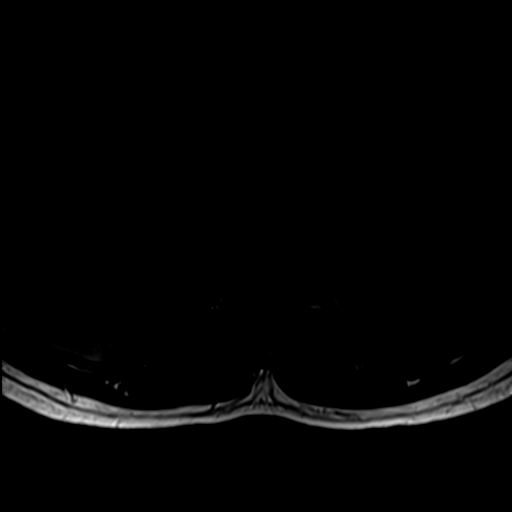

[Series 8: T1 fat-sat post-contrast · sagittal · 4.0mm · 0.81mm/px · 4 of 15 slices shown]
[im 1/15]
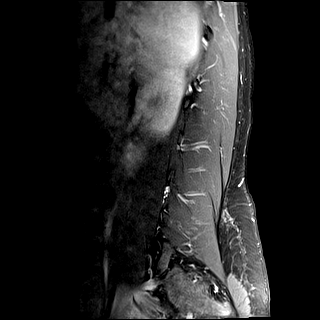
[im 5/15]
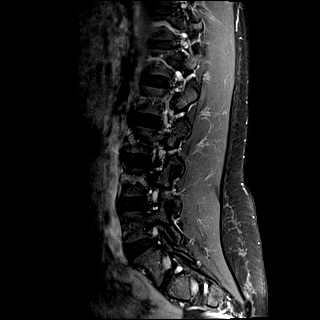
[im 10/15]
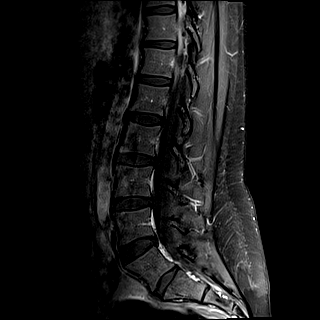
[im 15/15]
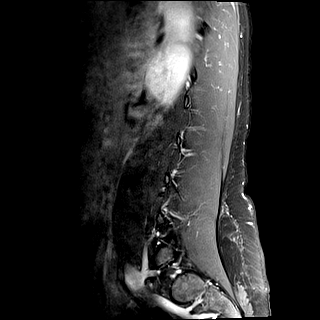

[34 of 48 positions shown; findings below may reference images not displayed]

FINDINGS: Lumbar vertebral bodies and posterior elements are intact and
aligned with maintenance of lumbar lordosis. Using the reference
level of the last well-formed intervertebral disc as L5-S1,
transitional anatomy with sacralized L5 vertebral body, RIGHT L5-S1
pseudoarthrosis. Intervertebral discs demonstrate normal morphology
and signal characteristics. No abnormal bone marrow signal. No
abnormal osseous or intradiscal enhancement.

Conus medullaris terminates at L1 and appears normal in morphology
and signal characteristics. Cauda equina is normal. No abnormal
cord, leptomeningeal or epidural enhancement. Included prevertebral
and paraspinal soft tissues are normal. Included view of the
thoracic spine demonstrate tiny RIGHT T11-12 disc protrusion.

Level by level evaluation:

T12-L1 through L5-S1: No disc bulge, canal stenosis or neural
foraminal narrowing.
IMPRESSION: Negative MRI of the lumbar spine with and without contrast.

## 2017-10-21 ENCOUNTER — Encounter: Payer: Self-pay | Admitting: *Deleted

## 2017-10-21 ENCOUNTER — Ambulatory Visit (INDEPENDENT_AMBULATORY_CARE_PROVIDER_SITE_OTHER): Payer: Self-pay

## 2017-10-21 ENCOUNTER — Ambulatory Visit
Admission: EM | Admit: 2017-10-21 | Discharge: 2017-10-21 | Disposition: A | Discharge status: Home / Self Care | Payer: Self-pay | Attending: Family Medicine | Admitting: Family Medicine

## 2017-10-21 DIAGNOSIS — M25561 Pain in right knee: Secondary | ICD-10-CM

## 2017-10-21 DIAGNOSIS — M79644 Pain in right finger(s): Secondary | ICD-10-CM

## 2017-10-21 DIAGNOSIS — S63501A Unspecified sprain of right wrist, initial encounter: Secondary | ICD-10-CM

## 2017-10-21 DIAGNOSIS — S8391XA Sprain of unspecified site of right knee, initial encounter: Secondary | ICD-10-CM

## 2017-10-21 DIAGNOSIS — M25531 Pain in right wrist: Secondary | ICD-10-CM

## 2017-10-21 DIAGNOSIS — W108XXA Fall (on) (from) other stairs and steps, initial encounter: Secondary | ICD-10-CM

## 2017-10-21 MED ORDER — KETOROLAC TROMETHAMINE 60 MG/2ML IM SOLN
30.0000 mg | Freq: Once | INTRAMUSCULAR | Status: AC
  Administered 2017-10-21: 30 mg via INTRAMUSCULAR

## 2017-10-21 MED ORDER — HYDROCODONE-ACETAMINOPHEN 5-325 MG PO TABS: ORAL_TABLET | ORAL | 0 refills | Status: DC

## 2017-10-21 NOTE — ED Provider Notes (Signed)
[facility]    CSN: [id number] Arrival date & time: 10/21/17  1640     History   Chief Complaint Chief Complaint  Patient presents with  . Wrist Pain    HPI Kristen Galvan is a 31 y.o. female.   31 yo female with a c/o right wrist, thumb and knee pain after falling at home today. States she missed the last step going down her stairs. Denies hitting her head or loss of consciousness.   The history is provided by the patient.  Wrist Pain     Past Medical History:  Diagnosis Date  . History of pneumothorax 1999    There are no active problems to display for this patient.   Past Surgical History:  Procedure Laterality Date  . APPENDECTOMY  2013  . BREAST BIOPSY Left 02/13/2016   path pending  . LUNG SURGERY Left 1999    OB History    No data available       Home Medications    Prior to Admission medications   Medication Sig Start Date End Date Taking? Authorizing Provider  HYDROcodone-acetaminophen (NORCO/VICODIN) 5-325 MG tablet 1-2 tabs po q 8 hours prn 10/21/17   Payton Mccallumonty, Immaculate Crutcher, MD  naproxen (NAPROSYN) 500 MG tablet Take 1 tablet (500 mg total) by mouth 2 (two) times daily with a meal. 02/07/16   Schuyler AmorPlonk, William, MD    Family History Family History  Problem Relation Age of Onset  . Hypertension Mother   . Diabetes Father     Social History Social History   Tobacco Use  . Smoking status: Never Smoker  . Smokeless tobacco: Never Used  Substance Use Topics  . Alcohol use: No    Alcohol/week: 0.0 oz  . Drug use: No     Allergies   Patient has no known allergies.   Review of Systems Review of Systems   Physical Exam Triage Vital Signs ED Triage Vitals  Enc Vitals Group     BP 10/21/17 1654 102/70     Pulse Rate 10/21/17 1654 (!) 54     Resp 10/21/17 1654 12     Temp 10/21/17 1654 98.2 F (36.8 C)     Temp Source 10/21/17 1654 Oral     SpO2 10/21/17 1654 100 %     Weight 10/21/17 1650 127 lb (57.6 kg)     Height  10/21/17 1650 5\' 6"  (1.676 m)     Head Circumference --      Peak Flow --      Pain Score 10/21/17 1651 10     Pain Loc --      Pain Edu? --      Excl. in GC? --    No data found.  Updated Vital Signs BP 102/70 (BP Location: Left Arm)   Pulse (!) 54   Temp 98.2 F (36.8 C) (Oral)   Resp 12   Ht 5\' 6"  (1.676 m)   Wt 127 lb (57.6 kg)   LMP 10/21/2017   SpO2 100%   BMI 20.50 kg/m   Visual Acuity Right Eye Distance:   Left Eye Distance:   Bilateral Distance:    Right Eye Near:   Left Eye Near:    Bilateral Near:     Physical Exam  Constitutional: She appears well-developed and well-nourished. No distress.  Musculoskeletal:       Right wrist: She exhibits bony tenderness and swelling. She exhibits no laceration.       Right knee: She  exhibits bony tenderness. She exhibits no swelling, no laceration, no erythema, normal alignment, no LCL laxity and normal patellar mobility.       Right hand: She exhibits bony tenderness (over thumb). She exhibits no laceration. Normal sensation noted. Normal strength noted.  Skin: She is not diaphoretic.  Vitals reviewed.    UC Treatments / Results  Labs (all labs ordered are listed, but only abnormal results are displayed) Labs Reviewed - No data to display  EKG  EKG Interpretation None       Radiology Dg Wrist Complete Right  Result Date: 10/21/2017 CLINICAL DATA:  Right wrist and knee pain after falling down stairs today. Initial encounter. EXAM: RIGHT WRIST - COMPLETE 3+ VIEW COMPARISON:  None. FINDINGS: The mineralization and alignment are normal. There is no evidence of acute fracture or dislocation. The joint spaces are preserved. No focal soft tissue swelling identified. IMPRESSION: No evidence of acute osseous injury. Electronically Signed   By: Carey BullocksWilliam  Veazey M.D.   On: 10/21/2017 17:42   Dg Knee Complete 4 Views Right  Result Date: 10/21/2017 CLINICAL DATA:  Right wrist and knee pain after falling down stairs  today. Initial encounter. EXAM: RIGHT KNEE - COMPLETE 4+ VIEW COMPARISON:  None. FINDINGS: The mineralization and alignment are normal. There is no evidence of acute fracture or dislocation. The joint spaces are maintained. No large knee joint effusion or focal soft tissue abnormalities are seen. IMPRESSION: No acute osseous findings. Electronically Signed   By: Carey BullocksWilliam  Veazey M.D.   On: 10/21/2017 17:43   Dg Finger Thumb Right  Result Date: 10/21/2017 CLINICAL DATA:  Right wrist and knee pain after falling down stairs today. Initial encounter. EXAM: RIGHT THUMB 2+V COMPARISON:  None. FINDINGS: The mineralization and alignment are normal. There is no evidence of acute fracture or dislocation. The joint spaces are maintained. No focal soft tissue swelling identified. IMPRESSION: No acute osseous findings. Electronically Signed   By: Carey BullocksWilliam  Veazey M.D.   On: 10/21/2017 17:42    Procedures Procedures (including critical care time)  Medications Ordered in UC Medications  ketorolac (TORADOL) injection 30 mg (30 mg Intramuscular Given 10/21/17 1709)     Initial Impression / Assessment and Plan / UC Course  I have reviewed the triage vital signs and the nursing notes.  Pertinent labs & imaging results that were available during my care of the patient were reviewed by me and considered in my medical decision making (see chart for details).       Final Clinical Impressions(s) / UC Diagnoses   Final diagnoses:  Sprain of right wrist, initial encounter  Sprain of right knee, unspecified ligament, initial encounter    ED Discharge Orders        Ordered    HYDROcodone-acetaminophen (NORCO/VICODIN) 5-325 MG tablet     10/21/17 1754    1. X- ray results (negative) and diagnosis reviewed with patient 2. rx as per orders above; reviewed possible side effects, interactions, risks and benefits  3. Recommend supportive treatment with otc analgesics, ice,rest  4. Follow-up prn if symptoms  worsen or don't improve  Controlled Substance Prescriptions [place] Controlled Substance Registry consulted? Not Applicable   Payton Mccallumonty, Bentli Llorente, MD 10/21/17 1944

## 2017-10-21 NOTE — Discharge Instructions (Signed)
Rest, ice, over the counter ibuprofen/tylenol 

## 2017-10-21 NOTE — ED Triage Notes (Signed)
Felt down the stairs at her home today about 20 minutes ago. Right wrist and knee are hurting 10/10

## 2017-11-22 ENCOUNTER — Other Ambulatory Visit: Payer: Self-pay

## 2017-11-22 ENCOUNTER — Emergency Department: Payer: Self-pay

## 2017-11-22 ENCOUNTER — Emergency Department
Admission: EM | Admit: 2017-11-22 | Discharge: 2017-11-22 | Disposition: A | Discharge status: Home / Self Care | Payer: Self-pay | Attending: Emergency Medicine | Admitting: Emergency Medicine

## 2017-11-22 ENCOUNTER — Encounter: Payer: Self-pay | Admitting: Emergency Medicine

## 2017-11-22 DIAGNOSIS — Z79899 Other long term (current) drug therapy: Secondary | ICD-10-CM | POA: Insufficient documentation

## 2017-11-22 DIAGNOSIS — J069 Acute upper respiratory infection, unspecified: Secondary | ICD-10-CM | POA: Insufficient documentation

## 2017-11-22 DIAGNOSIS — M549 Dorsalgia, unspecified: Secondary | ICD-10-CM | POA: Insufficient documentation

## 2017-11-22 DIAGNOSIS — J029 Acute pharyngitis, unspecified: Secondary | ICD-10-CM | POA: Insufficient documentation

## 2017-11-22 DIAGNOSIS — R0789 Other chest pain: Secondary | ICD-10-CM

## 2017-11-22 LAB — BASIC METABOLIC PANEL
Anion gap: 8 (ref 5–15)
BUN: 10 mg/dL (ref 6–20)
CHLORIDE: 109 mmol/L (ref 101–111)
CO2: 23 mmol/L (ref 22–32)
Calcium: 8.9 mg/dL (ref 8.9–10.3)
Creatinine, Ser: 0.81 mg/dL (ref 0.44–1.00)
GFR calc Af Amer: >60 mL/min (ref >60)
GFR calc non Af Amer: >60 mL/min (ref >60)
Glucose, Bld: 109 mg/dL — ABNORMAL HIGH (ref 65–99)
POTASSIUM: 3.4 mmol/L — AB (ref 3.5–5.1)
SODIUM: 140 mmol/L (ref 135–145)

## 2017-11-22 LAB — CBC
HEMATOCRIT: 37.9 % (ref 35.0–47.0)
Hemoglobin: 12.7 g/dL (ref 12.0–16.0)
MCH: 29.5 pg (ref 26.0–34.0)
MCHC: 33.6 g/dL (ref 32.0–36.0)
MCV: 87.9 fL (ref 80.0–100.0)
Platelets: 224 10*3/uL (ref 150–440)
RBC: 4.31 MIL/uL (ref 3.80–5.20)
RDW: 14.2 % (ref 11.5–14.5)
WBC: 10.5 10*3/uL (ref 3.6–11.0)

## 2017-11-22 LAB — TROPONIN I: Troponin I: <0.03 ng/mL (ref <0.03)

## 2017-11-22 NOTE — ED Notes (Signed)
NAD noted at time of D/C. Pt denies questions or concerns. Pt ambulatory to the lobby at this time.  

## 2017-11-22 NOTE — ED Provider Notes (Addendum)
[facility] Emergency Department Provider Note  ____________________________________________   I have reviewed the triage vital signs and the nursing notes. Where available I have reviewed prior notes and, if possible and indicated, outside hospital notes.    HISTORY  Chief Complaint Chest Pain    HPI Kristen Galvan is a 31 y.o. female with a remote history of pneumothorax years ago presents today with rhinorrhea, sore throat, cough, pain with cough, "laryngitis" no fever no chills no headache no stiff neck no fever, symptoms have been there for a couple days.  Mostly it hurts in her upper back when she coughs.  She wants to make sure she does not have another pneumothorax.  Location upper back radiation none quality Sharp, duration a few days, worse with cough, not better less coughing, associated symptoms, see above, prior treatment none     Past Medical History:  Diagnosis Date  . History of pneumothorax 1999    There are no active problems to display for this patient.   Past Surgical History:  Procedure Laterality Date  . APPENDECTOMY  2013  . BREAST BIOPSY Left 02/13/2016   path pending  . LUNG SURGERY Left 1999    Prior to Admission medications   Medication Sig Start Date End Date Taking? Authorizing Provider  HYDROcodone-acetaminophen (NORCO/VICODIN) 5-325 MG tablet 1-2 tabs po q 8 hours prn 10/21/17   Payton Mccallumonty, [place], MD  naproxen (NAPROSYN) 500 MG tablet Take 1 tablet (500 mg total) by mouth 2 (two) times daily with a meal. 02/07/16   Schuyler AmorPlonk, William, MD    Allergies Patient has no known allergies.  Family History  Problem Relation Age of Onset  . Hypertension Mother   . Diabetes Father     Social History Social History   Tobacco Use  . Smoking status: Never Smoker  . Smokeless tobacco: Never Used  Substance Use Topics  . Alcohol use: No    Alcohol/week: 0.0 oz  . Drug use: No    Review of Systems Constitutional: No  fever/chills Eyes: No visual changes. ENT: Positive sore throat. No stiff neck no neck pain Cardiovascular: See HPI Respiratory: Denies shortness of breath.  Positive cough Gastrointestinal:   no vomiting.  No diarrhea.  No constipation. Genitourinary: Negative for dysuria. Musculoskeletal: Negative lower extremity swelling Skin: Negative for rash. Neurological: Negative for severe headaches, focal weakness or numbness.   ____________________________________________   PHYSICAL EXAM:  VITAL SIGNS: ED Triage Vitals  Enc Vitals Group     BP 11/22/17 1327 125/84     Pulse Rate 11/22/17 1327 (!) 111     Resp 11/22/17 1327 18     Temp 11/22/17 1327 98.8 F (37.1 C)     Temp Source 11/22/17 1327 Oral     SpO2 11/22/17 1327 96 %     Weight 11/22/17 1333 125 lb (56.7 kg)     Height 11/22/17 1333 5\' 6"  (1.676 m)     Head Circumference --      Peak Flow --      Pain Score 11/22/17 1332 7     Pain Loc --      Pain Edu? --      Excl. in GC? --     Constitutional: Alert and oriented. Well appearing and in no acute distress.  On cell phone in no distress Eyes: Conjunctivae are normal Head: Atraumatic HEENT: Positive clear congestion/rhinnorhea. Mucous membranes are moist.  Oropharynx non-erythematous positive cobblestoning consistent with changes from postnasal drip  neck:  Nontender with no meningismus, no masses, no stridor Cardiovascular: Normal rate, regular rhythm. Grossly normal heart sounds.  Good peripheral circulation. Respiratory: Normal respiratory effort.  No retractions. Lungs CTAB. Abdominal: Soft and nontender. No distention. No guarding no rebound Back: There is tenderness to palpation in the paraspinal muscles in the thoracic region diffusely which reproduces her pain.  Marland Kitchen.  there is no midlinetenderness there are no lesions noted. there is no CVA tenderness Musculoskeletal: No lower extremity tenderness, no upper extremity tenderness. No joint effusions, no DVT signs  strong distal pulses no edema Neurologic:  Normal speech and language. No gross focal neurologic deficits are appreciated.  Skin:  Skin is warm, dry and intact. No rash noted. Psychiatric: Mood and affect are normal. Speech and behavior are normal.  ____________________________________________   LABS (all labs ordered are listed, but only abnormal results are displayed)  Labs Reviewed  BASIC METABOLIC PANEL - Abnormal; Notable for the following components:      Result Value   Potassium 3.4 (*)    Glucose, Bld 109 (*)    All other components within normal limits  CBC  TROPONIN I    Pertinent labs  results that were available during my care of the patient were reviewed by me and considered in my medical decision making (see chart for details). ____________________________________________  EKG  I personally interpreted any EKGs ordered by me or triage Normal sinus rhythm rate 111 bpm mild tachycardia noted no acute ischemic changes normal axis, nonspecific ST changes. ____________________________________________  RADIOLOGY  Pertinent labs & imaging results that were available during my care of the patient were reviewed by me and considered in my medical decision making (see chart for details). If possible, patient and/or family made aware of any abnormal findings.  Dg Chest 2 View  Result Date: 11/22/2017 CLINICAL DATA:  Productive cough, shortness of breath, chest pain EXAM: CHEST  2 VIEW COMPARISON:  02/16/2016 FINDINGS: Heart and mediastinal contours are within normal limits. No focal opacities or effusions. No acute bony abnormality. IMPRESSION: No active cardiopulmonary disease. Electronically Signed   By: Charlett NoseKevin  Dover M.D.   On: 11/22/2017 14:12   ____________________________________________    PROCEDURES  Procedure(s) performed: None  Procedures  Critical Care performed: None  ____________________________________________   INITIAL IMPRESSION / ASSESSMENT AND  PLAN / ED COURSE  Pertinent labs & imaging results that were available during my care of the patient were reviewed by me and considered in my medical decision making (see chart for details).  Cough and reproducible back pain, At this time, there does not appear to be clinical evidence to support the diagnosis of pulmonary embolus, dissection, myocarditis, endocarditis, pericarditis, pericardial tamponade, acute coronary syndrome, pneumothorax, pneumonia, or any other acute intrathoracic pathology that will require admission or acute intervention. Nor is there evidence of any significant intra-abdominal pathology causing this discomfort.  Return precautions follow-up given and understood this is very consistent with viral syndrome.  No indications for antibiotics at this time.  Patient very comfortable with discharge at this moment    ____________________________________________   FINAL CLINICAL IMPRESSION(S) / ED DIAGNOSES  Final diagnoses:  None      This chart was dictated using voice recognition software.  Despite best efforts to proofread,  errors can occur which can change meaning.      Jeanmarie PlantMcShane, Kelden Lavallee A, MD 11/22/17 1449    Jeanmarie PlantMcShane, Seaira Byus A, MD 11/22/17 [phone]

## 2017-11-22 NOTE — ED Notes (Signed)
FIRST NURSE NOTE: pt states she thinks her lung is collapsed.  Offered pt wheelchair, declined at this time. Pt states she has had a collapsed lung before.

## 2017-11-22 NOTE — ED Triage Notes (Signed)
Pt presents to ED c/o pain to mid chest radiating into back starting this morning. Pt states she has hx collapsed lung requiring surgery and was concerned because pain today feels similar. Also c/o sore throat and cough that she woke up with today.

## 2018-07-16 ENCOUNTER — Ambulatory Visit: Payer: Self-pay | Admitting: Advanced Practice Midwife

## 2018-08-05 ENCOUNTER — Encounter: Payer: Self-pay | Admitting: Advanced Practice Midwife

## 2018-08-05 ENCOUNTER — Ambulatory Visit: Payer: Self-pay | Admitting: Advanced Practice Midwife

## 2018-08-05 VITALS — BP 100/60 | Wt 125.0 lb

## 2018-08-05 DIAGNOSIS — Z01419 Encounter for gynecological examination (general) (routine) without abnormal findings: Secondary | ICD-10-CM

## 2018-08-05 DIAGNOSIS — Z30432 Encounter for removal of intrauterine contraceptive device: Secondary | ICD-10-CM

## 2018-08-05 NOTE — Progress Notes (Signed)
Patient ID: Kristen Galvan, female   DOB: 1986-06-11, 32 y.o.   MRN: [id number]     Gynecology Annual Exam   PCP: Patient, No Pcp Per  Chief Complaint:  Chief Complaint  Patient presents with  . Gynecologic Exam  . Contraception    History of Present Illness: Patient is a 32 y.o. G1P1001 presents for annual exam. The patient has no complaints today. She is in her 5th year with Mirena IUD and requests removal today. She is not currently sexually active and does not want any hormonal contraception at this time.   LMP: No LMP recorded. (Menstrual status: IUD). Intermenstrual Bleeding: not applicable Postcoital Bleeding: not applicable Dysmenorrhea: not applicable  The patient is not currently sexually active. She currently uses IUD for contraception. She denies dyspareunia.  The patient does perform self breast exams.  There is no notable family history of breast or ovarian cancer in her family.  The patient wears seatbelts: yes.   The patient has regular exercise: she does a variety of exercises and is active with her 33 year old. She admits a healthy diet. She admits she needs to increase h2o consumption.    The patient denies current symptoms of depression.  She has some anxiety related to social circumstances but denies the need for medication. She is able to cope well.   Review of Systems: Review of Systems  Constitutional: Negative.   HENT: Negative.   Eyes: Negative.   Respiratory: Negative.   Cardiovascular: Negative.   Gastrointestinal: Negative.   Genitourinary: Negative.   Musculoskeletal: Negative.   Skin: Negative.   Neurological: Negative.   Endo/Heme/Allergies: Negative.   Psychiatric/Behavioral: Negative.     Past Medical History:  Past Medical History:  Diagnosis Date  . History of pneumothorax 1999    Past Surgical History:  Past Surgical History:  Procedure Laterality Date  . APPENDECTOMY  2013  . BREAST BIOPSY Left 02/13/2016   path pending    . LUNG SURGERY Left 1999    Gynecologic History:  No LMP recorded. (Menstrual status: IUD). Contraception: IUD Last Pap: 2 years ago Results were: no abnormalities   Obstetric History: G1P1001  Family History:  Family History  Problem Relation Age of Onset  . Hypertension Mother   . Diabetes Father     Social History:  Social History   Socioeconomic History  . Marital status: Single    Spouse name: Not on file  . Number of children: Not on file  . Years of education: Not on file  . Highest education level: Not on file  Occupational History  . Not on file  Social Needs  . Financial resource strain: Not on file  . Food insecurity:    Worry: Not on file    Inability: Not on file  . Transportation needs:    Medical: Not on file    Non-medical: Not on file  Tobacco Use  . Smoking status: Never Smoker  . Smokeless tobacco: Never Used  Substance and Sexual Activity  . Alcohol use: No    Alcohol/week: 0.0 standard drinks  . Drug use: No  . Sexual activity: Not Currently    Birth control/protection: IUD  Lifestyle  . Physical activity:    Days per week: Not on file    Minutes per session: Not on file  . Stress: Not on file  Relationships  . Social connections:    Talks on phone: Not on file    Gets together: Not on file  Attends religious service: Not on file    Active member of club or organization: Not on file    Attends meetings of clubs or organizations: Not on file    Relationship status: Not on file  . Intimate partner violence:    Fear of current or ex partner: Not on file    Emotionally abused: Not on file    Physically abused: Not on file    Forced sexual activity: Not on file  Other Topics Concern  . Not on file  Social History Narrative  . Not on file    Allergies:  No Known Allergies  Medications: Prior to Admission medications   Medication Sig Start Date End Date Taking? Authorizing Provider  HYDROcodone-acetaminophen (NORCO/VICODIN)  5-325 MG tablet 1-2 tabs po q 8 hours prn Patient not taking: Reported on 08/05/2018 10/21/17   Payton Mccallum, MD  naproxen (NAPROSYN) 500 MG tablet Take 1 tablet (500 mg total) by mouth 2 (two) times daily with a meal. Patient not taking: Reported on 08/05/2018 02/07/16   Schuyler Amor, MD    Physical Exam Vitals: Blood pressure 100/60, weight 125 lb (56.7 kg).  General: NAD HEENT: normocephalic, anicteric Thyroid: no enlargement, no palpable nodules Pulmonary: No increased work of breathing, CTAB Cardiovascular: RRR, distal pulses 2+ Breast: Breast symmetrical, no tenderness, no palpable nodules or masses, no skin or nipple retraction present, no nipple discharge.  No axillary or supraclavicular lymphadenopathy. Abdomen: NABS, soft, non-tender, non-distended.  Umbilicus without lesions.  No hepatomegaly, splenomegaly or masses palpable. No evidence of hernia  Genitourinary:  External: Normal external female genitalia.  Normal urethral meatus, normal Bartholin's and Skene's glands.    Vagina: Normal vaginal mucosa, no evidence of prolapse.    Cervix: Grossly normal in appearance, no bleeding, no CMT  Uterus: deferred for no concerns/shared decision making  Adnexa: deferred for no concerns/shared decision making  Rectal: deferred  Lymphatic: no evidence of inguinal lymphadenopathy Extremities: no edema, erythema, or tenderness Neurologic: Grossly intact Psychiatric: mood appropriate, affect full   Assessment: 32 y.o. G1P1001 routine annual exam  Plan: Problem List Items Addressed This Visit    None    Visit Diagnoses    Well woman exam with routine gynecological exam    -  Primary   Encounter for removal of intrauterine contraceptive device (IUD)          2) STI screening  wasoffered and declined  2)  ASCCP guidelines and rational discussed.  Patient opts for every 3 years screening interval  3) Contraception - the patient is currently using  IUD.  She is requesting removal  today and no replacement. She is not currently sexually active.  4) Routine healthcare maintenance including cholesterol, diabetes screening discussed Declines  5) Return in 1 year (on 08/06/2019) for annual established gyn.   Tresea Mall, CNM [facility], [facility] 08/05/2018, 2:12 PM      GYNECOLOGY OFFICE PROCEDURE NOTE  Kristen Galvan is a 32 y.o. G1P1001 here for IUD removal. The patient currently has a Mirena IUD placed approximately 5 years ago. No GYN concerns.  Last pap smear was 2017 and was normal. She is not currently sexually active.  IUD Removal   Patient identified, verbal informed consent.   Discussed return to fertility and regular menstrual periods. Time out was performed. Speculum placed in the vagina. The strings of the IUD were grasped and pulled using ring forceps. The IUD was successfully removed in its entirety. Patient tolerated procedure well.  Tresea MallJane Trystyn Galvan, CNM [facility], [facility]

## 2018-08-05 NOTE — Patient Instructions (Signed)
 Contraception Choices Contraception, also called birth control, refers to methods or devices that prevent pregnancy. Hormonal methods Contraceptive implant A contraceptive implant is a thin, plastic tube that contains a hormone. It is inserted into the upper part of the arm. It can remain in place for up to 3 years. Progestin-only injections Progestin-only injections are injections of progestin, a synthetic form of the hormone progesterone. They are given every 3 months by a health care provider. Birth control pills Birth control pills are pills that contain hormones that prevent pregnancy. They must be taken once a day, preferably at the same time each day. Birth control patch The birth control patch contains hormones that prevent pregnancy. It is placed on the skin and must be changed once a week for three weeks and removed on the fourth week. A prescription is needed to use this method of contraception. Vaginal ring A vaginal ring contains hormones that prevent pregnancy. It is placed in the vagina for three weeks and removed on the fourth week. After that, the process is repeated with a new ring. A prescription is needed to use this method of contraception. Emergency contraceptive Emergency contraceptives prevent pregnancy after unprotected sex. They come in pill form and can be taken up to 5 days after sex. They work best the sooner they are taken after having sex. Most emergency contraceptives are available without a prescription. This method should not be used as your only form of birth control. Barrier methods Female condom A female condom is a thin sheath that is worn over the penis during sex. Condoms keep sperm from going inside a woman's body. They can be used with a spermicide to increase their effectiveness. They should be disposed after a single use. Female condom A female condom is a soft, loose-fitting sheath that is put into the vagina before sex. The condom keeps sperm from  going inside a woman's body. They should be disposed after a single use. Diaphragm A diaphragm is a soft, dome-shaped barrier. It is inserted into the vagina before sex, along with a spermicide. The diaphragm blocks sperm from entering the uterus, and the spermicide kills sperm. A diaphragm should be left in the vagina for 6-8 hours after sex and removed within 24 hours. A diaphragm is prescribed and fitted by a health care provider. A diaphragm should be replaced every 1-2 years, after giving birth, after gaining more than 15 lb (6.8 kg), and after pelvic surgery. Cervical cap A cervical cap is a round, soft latex or plastic cup that fits over the cervix. It is inserted into the vagina before sex, along with spermicide. It blocks sperm from entering the uterus. The cap should be left in place for 6-8 hours after sex and removed within 48 hours. A cervical cap must be prescribed and fitted by a health care provider. It should be replaced every 2 years. Sponge A sponge is a soft, circular piece of polyurethane foam with spermicide on it. The sponge helps block sperm from entering the uterus, and the spermicide kills sperm. To use it, you make it wet and then insert it into the vagina. It should be inserted before sex, left in for at least 6 hours after sex, and removed and thrown away within 30 hours. Spermicides Spermicides are chemicals that kill or block sperm from entering the cervix and uterus. They can come as a cream, jelly, suppository, foam, or tablet. A spermicide should be inserted into the vagina with an applicator at least 10-15 minutes   before sex to allow time for it to work. The process must be repeated every time you have sex. Spermicides do not require a prescription. Intrauterine contraception Intrauterine device (IUD) An IUD is a T-shaped device that is put in a woman's uterus. There are two types:  Hormone IUD.This type contains progestin, a synthetic form of the hormone  progesterone. This type can stay in place for 3-5 years.  Copper IUD.This type is wrapped in copper wire. It can stay in place for 10 years.  Permanent methods of contraception Female tubal ligation In this method, a woman's fallopian tubes are sealed, tied, or blocked during surgery to prevent eggs from traveling to the uterus. Hysteroscopic sterilization In this method, a small, flexible insert is placed into each fallopian tube. The inserts cause scar tissue to form in the fallopian tubes and block them, so sperm cannot reach an egg. The procedure takes about 3 months to be effective. Another form of birth control must be used during those 3 months. Female sterilization This is a procedure to tie off the tubes that carry sperm (vasectomy). After the procedure, the man can still ejaculate fluid (semen). Natural planning methods Natural family planning In this method, a couple does not have sex on days when the woman could become pregnant. Calendar method This means keeping track of the length of each menstrual cycle, identifying the days when pregnancy can happen, and not having sex on those days. Ovulation method In this method, a couple avoids sex during ovulation. Symptothermal method This method involves not having sex during ovulation. The woman typically checks for ovulation by watching changes in her temperature and in the consistency of cervical mucus. Post-ovulation method In this method, a couple waits to have sex until after ovulation. Summary  Contraception, also called birth control, means methods or devices that prevent pregnancy.  Hormonal methods of contraception include implants, injections, pills, patches, vaginal rings, and emergency contraceptives.  Barrier methods of contraception can include female condoms, female condoms, diaphragms, cervical caps, sponges, and spermicides.  There are two types of IUDs (intrauterine devices). An IUD can be put in a woman's uterus to  prevent pregnancy for 3-5 years.  Permanent sterilization can be done through a procedure for males, females, or both.  Natural family planning methods involve not having sex on days when the woman could become pregnant. This information is not intended to replace advice given to you by your health care provider. Make sure you discuss any questions you have with your health care provider. Document Released: 11/24/2005 Document Revised: 12/27/2016 Document Reviewed: 12/27/2016 Elsevier Interactive Patient Education  2018 Elsevier Inc.   Preventive Care 18-39 Years, Female Preventive care refers to lifestyle choices and visits with your health care provider that can promote health and wellness. What does preventive care include?  A yearly physical exam. This is also called an annual well check.  Dental exams once or twice a year.  Routine eye exams. Ask your health care provider how often you should have your eyes checked.  Personal lifestyle choices, including: ? Daily care of your teeth and gums. ? Regular physical activity. ? Eating a healthy diet. ? Avoiding tobacco and drug use. ? Limiting alcohol use. ? Practicing safe sex. ? Taking vitamin and mineral supplements as recommended by your health care provider. What happens during an annual well check? The services and screenings done by your health care provider during your annual well check will depend on your age, overall health, lifestyle risk factors,   and family history of disease. Counseling Your health care provider may ask you questions about your:  Alcohol use.  Tobacco use.  Drug use.  Emotional well-being.  Home and relationship well-being.  Sexual activity.  Eating habits.  Work and work environment.  Method of birth control.  Menstrual cycle.  Pregnancy history.  Screening You may have the following tests or measurements:  Height, weight, and BMI.  Diabetes screening. This is done by checking  your blood sugar (glucose) after you have not eaten for a while (fasting).  Blood pressure.  Lipid and cholesterol levels. These may be checked every 5 years starting at age 20.  Skin check.  Hepatitis C blood test.  Hepatitis B blood test.  Sexually transmitted disease (STD) testing.  BRCA-related cancer screening. This may be done if you have a family history of breast, ovarian, tubal, or peritoneal cancers.  Pelvic exam and Pap test. This may be done every 3 years starting at age 21. Starting at age 30, this may be done every 5 years if you have a Pap test in combination with an HPV test.  Discuss your test results, treatment options, and if necessary, the need for more tests with your health care provider. Vaccines Your health care provider may recommend certain vaccines, such as:  Influenza vaccine. This is recommended every year.  Tetanus, diphtheria, and acellular pertussis (Tdap, Td) vaccine. You may need a Td booster every 10 years.  Varicella vaccine. You may need this if you have not been vaccinated.  HPV vaccine. If you are 26 or younger, you may need three doses over 6 months.  Measles, mumps, and rubella (MMR) vaccine. You may need at least one dose of MMR. You may also need a second dose.  Pneumococcal 13-valent conjugate (PCV13) vaccine. You may need this if you have certain conditions and were not previously vaccinated.  Pneumococcal polysaccharide (PPSV23) vaccine. You may need one or two doses if you smoke cigarettes or if you have certain conditions.  Meningococcal vaccine. One dose is recommended if you are age 19-21 years and a first-year college student living in a residence hall, or if you have one of several medical conditions. You may also need additional booster doses.  Hepatitis A vaccine. You may need this if you have certain conditions or if you travel or work in places where you may be exposed to hepatitis A.  Hepatitis B vaccine. You may need  this if you have certain conditions or if you travel or work in places where you may be exposed to hepatitis B.  Haemophilus influenzae type b (Hib) vaccine. You may need this if you have certain risk factors.  Talk to your health care provider about which screenings and vaccines you need and how often you need them. This information is not intended to replace advice given to you by your health care provider. Make sure you discuss any questions you have with your health care provider. Document Released: 01/20/2002 Document Revised: 08/13/2016 Document Reviewed: 09/25/2015 Elsevier Interactive Patient Education  2018 Elsevier Inc.  

## 2020-04-18 ENCOUNTER — Ambulatory Visit: Payer: Self-pay | Attending: Internal Medicine

## 2020-04-18 DIAGNOSIS — Z23 Encounter for immunization: Secondary | ICD-10-CM

## 2020-04-18 NOTE — Progress Notes (Signed)
   Covid-19 Vaccination Clinic  Name:  CHARYL MINERVINI    MRN: [id number] DOB: May 17, 1986  04/18/2020  Ms. Tetzlaff was observed post Covid-19 immunization for 15 minutes without incident. She was provided with Vaccine Information Sheet and instruction to access the V-Safe system.   Ms. Vega was instructed to call 911 with any severe reactions post vaccine: Marland Kitchen Difficulty breathing  . Swelling of face and throat  . A fast heartbeat  . A bad rash all over body  . Dizziness and weakness   Immunizations Administered    Name Date Dose VIS Date Route   Pfizer COVID-19 Vaccine 04/18/2020  9:53 AM 0.3 mL 02/01/2019 Intramuscular   Manufacturer: [employer], [employer]   Lot: [id number]   NDC: [address]-5

## 2020-05-08 ENCOUNTER — Ambulatory Visit: Payer: Self-pay | Attending: Internal Medicine

## 2020-05-08 DIAGNOSIS — Z23 Encounter for immunization: Secondary | ICD-10-CM

## 2020-05-08 NOTE — Progress Notes (Signed)
   Covid-19 Vaccination Clinic  Name:  Kristen Galvan    MRN: [id number] DOB: [birth year]  05/08/2020  [name] was observed post Covid-19 immunization for 15 minutes without incident. She was provided with Vaccine Information Sheet and instruction to access the V-Safe system.   [name] was instructed to call 911 with any severe reactions post vaccine: Marland Kitchen Difficulty breathing  . Swelling of face and throat  . A fast heartbeat  . A bad rash all over body  . Dizziness and weakness   Immunizations Administered    Name Date Dose VIS Date Route   Pfizer COVID-19 Vaccine 05/08/2020 11:12 AM 0.3 mL 02/01/2019 Intramuscular   Manufacturer: [employer], [employer]   Lot: [id number]   NDC: [id number]

## 2020-11-22 ENCOUNTER — Ambulatory Visit (INDEPENDENT_AMBULATORY_CARE_PROVIDER_SITE_OTHER): Payer: Self-pay

## 2020-11-22 ENCOUNTER — Other Ambulatory Visit: Payer: Self-pay

## 2020-11-22 ENCOUNTER — Ambulatory Visit
Admission: EM | Admit: 2020-11-22 | Discharge: 2020-11-22 | Disposition: A | Discharge status: Home / Self Care | Payer: Self-pay | Attending: Physician Assistant | Admitting: Physician Assistant

## 2020-11-22 DIAGNOSIS — R079 Chest pain, unspecified: Secondary | ICD-10-CM

## 2020-11-22 DIAGNOSIS — R059 Cough, unspecified: Secondary | ICD-10-CM

## 2020-11-22 DIAGNOSIS — J069 Acute upper respiratory infection, unspecified: Secondary | ICD-10-CM

## 2020-11-22 DIAGNOSIS — R0981 Nasal congestion: Secondary | ICD-10-CM

## 2020-11-22 DIAGNOSIS — R0789 Other chest pain: Secondary | ICD-10-CM

## 2020-11-22 NOTE — ED Triage Notes (Signed)
Pt states she had negative COVID 12/15 with home kit.  Does not want tested today.  Cancelled order, provider notified.

## 2020-11-22 NOTE — ED Triage Notes (Signed)
Pt reports s/s starting two days ago.  Cough, vomiting, itchy throat. Reports after a while of coughing she started having sharp pains with deep breathing.  Also had lower back pain.  All symptoms have improved but she has come into urgent care bc she had a collapsed lung as a teenager and she wants to make sure her lungs are ok. Denies dizziness, CP radiating to jaw or arm.  Called EMS when symptoms started and was told her EKG was normal.

## 2020-11-22 NOTE — Discharge Instructions (Signed)
Your chest x-ray is within normal limits.  There is no sign of pneumonia, pneumothorax, or any other abnormality.  If you have a return of severe chest pain, call EMS or have someone take you to the emergency department.  CHEST PAIN RED FLAGS:  The main red flags to look out for that could mean a heart attack are; pain in the center of the chest, difficulty breathing, nausea, sweating, clammy skin, dizziness, pain radiating to the jaw/neck/arm, racing heart, etc. If any of those symptoms are present, you must immediately get to the ER.   URI/COLD SYMPTOMS: Your exam today is consistent with a viral illness. Antibiotics are not indicated at this time. Use medications as directed, including cough syrup, nasal saline, and decongestants. Your symptoms should improve over the next few days and resolve within 7-10 days. Increase rest and fluids. F/u if symptoms worsen or predominate such as sore throat, ear pain, productive cough, shortness of breath, or if you develop high fevers or worsening fatigue over the next several days.

## 2020-11-22 NOTE — ED Provider Notes (Addendum)
[facility]    CSN: [id number] Arrival date & time: 11/22/20  0815      History   Chief Complaint Chief Complaint  Patient presents with  . Chest Pain    HPI Kristen Galvan is a 34 y.o. female presenting for 2-day history of cough and scratchy throat.  Patient also admits to couple episodes of vomiting 2 days ago.  She says that she started to have a runny nose today.  Patient states that when she has a coughing fit she will start to have pains in her chest.  Patient says most of her symptoms have improved, but she has a remote history of pneumothorax and wants to make sure that she does not have another collapsed lung.  Patient states that she is actually not very concerned, but her mother urged her to get checked out because she had a very traumatic experience with the pneumothorax she had when she was 34 years old.  Patient states that she did call EMS 2 days ago when symptoms started and had a EKG performed at that time and was assured that the EKG was normal and the chest pain was probably from leaning over the toilet to vomit.  Patient states that she only has the pain in her chest when she takes a "really big deep breath."  Denies any radiation of pain.  Denies any associated dizziness, weakness, palpitations, breathing difficulty or wheezing.  Denies any history of cardiac disease.  Patient has been taking over-the-counter cough medication states that helps symptoms.  Patient states she took an at home Covid 19 test that was negative this morning.  She denies any known COVID-19 exposure or influenza exposure.  She declines any COVID-19 testing today.  She has no other complaints or concerns today.  HPI  Past Medical History:  Diagnosis Date  . History of pneumothorax 1999    There are no problems to display for this patient.   Past Surgical History:  Procedure Laterality Date  . APPENDECTOMY  2013  . BREAST BIOPSY Left 02/13/2016   path pending  . LUNG  SURGERY Left 1999    OB History    Gravida  1   Para  1   Term  1   Preterm      AB      Living  1     SAB      IAB      Ectopic      Multiple      Live Births               Home Medications    Prior to Admission medications   Medication Sig Start Date End Date Taking? Authorizing Provider  HYDROcodone-acetaminophen (NORCO/VICODIN) 5-325 MG tablet 1-2 tabs po q 8 hours prn Patient not taking: No sig reported 10/21/17   Payton Mccallum, MD  naproxen (NAPROSYN) 500 MG tablet Take 1 tablet (500 mg total) by mouth 2 (two) times daily with a meal. Patient not taking: No sig reported 02/07/16   Schuyler Amor, MD    Family History Family History  Problem Relation Age of Onset  . Hypertension Mother   . Diabetes Father     Social History Social History   Tobacco Use  . Smoking status: Never Smoker  . Smokeless tobacco: Never Used  Vaping Use  . Vaping Use: Never used  Substance Use Topics  . Alcohol use: No    Alcohol/week: 0.0 standard drinks  . Drug use:  No     Allergies   Patient has no known allergies.   Review of Systems Review of Systems  Constitutional: Negative for chills, diaphoresis, fatigue and fever.  HENT: Positive for rhinorrhea and sore throat. Negative for congestion, ear pain, sinus pressure and sinus pain.   Respiratory: Positive for cough. Negative for chest tightness, shortness of breath and wheezing.   Cardiovascular: Positive for chest pain. Negative for palpitations and leg swelling.  Gastrointestinal: Negative for abdominal pain, nausea and vomiting.  Musculoskeletal: Negative for arthralgias and myalgias.  Skin: Negative for rash.  Neurological: Negative for weakness and headaches.  Hematological: Negative for adenopathy.     Physical Exam Triage Vital Signs ED Triage Vitals  Enc Vitals Group     BP 11/22/20 0833 101/71     Pulse Rate 11/22/20 0833 78     Resp 11/22/20 0833 16     Temp 11/22/20 0833 98.3 F (36.8  C)     Temp src --      SpO2 11/22/20 0833 97 %     Weight --      Height --      Head Circumference --      Peak Flow --      Pain Score 11/22/20 0829 0     Pain Loc --      Pain Edu? --      Excl. in GC? --    No data found.  Updated Vital Signs BP 101/71 (BP Location: Left Arm)   Pulse 78   Temp 98.3 F (36.8 C)   Resp 16   LMP 11/15/2020   SpO2 97%       Physical Exam Vitals and nursing note reviewed.  Constitutional:      General: She is not in acute distress.    Appearance: Normal appearance. She is well-developed. She is not ill-appearing or toxic-appearing.     Comments: Patient is well-appearing.  She presents to urgent care with [employer] cup and hand.  In no acute distress.  HENT:     Head: Normocephalic and atraumatic.     Nose: Rhinorrhea (trace clear drainage) present.     Mouth/Throat:     Mouth: Mucous membranes are moist.     Pharynx: Oropharynx is clear. Posterior oropharyngeal erythema (mild with clear PND) present.  Eyes:     General: No scleral icterus.       Right eye: No discharge.        Left eye: No discharge.     Conjunctiva/sclera: Conjunctivae normal.  Cardiovascular:     Rate and Rhythm: Normal rate and regular rhythm.     Heart sounds: Normal heart sounds.  Pulmonary:     Effort: Pulmonary effort is normal. No respiratory distress.     Breath sounds: Normal breath sounds. No wheezing, rhonchi or rales.  Chest:     Chest wall: No tenderness.  Abdominal:     Palpations: Abdomen is soft.     Tenderness: There is no abdominal tenderness.  Musculoskeletal:     Cervical back: Neck supple.     Thoracic back: Normal.     Lumbar back: Normal.  Skin:    General: Skin is dry.  Neurological:     General: No focal deficit present.     Mental Status: She is alert. Mental status is at baseline.     Motor: No weakness.     Gait: Gait normal.  Psychiatric:        Mood and Affect:  Mood normal.        Behavior: Behavior normal.         Thought Content: Thought content normal.      UC Treatments / Results  Labs (all labs ordered are listed, but only abnormal results are displayed) Labs Reviewed - No data to display  EKG   Radiology DG Chest 2 View  Result Date: 11/22/2020 CLINICAL DATA:  Cough, chest pain. EXAM: CHEST - 2 VIEW COMPARISON:  November 22, 2017. FINDINGS: The heart size and mediastinal contours are within normal limits. Both lungs are clear. No pneumothorax or pleural effusion is noted. The visualized skeletal structures are unremarkable. IMPRESSION: No active cardiopulmonary disease. Electronically Signed   By: Lupita Raider M.D.   On: 11/22/2020 08:59    Procedures Procedures (including critical care time)  Medications Ordered in UC Medications - No data to display  Initial Impression / Assessment and Plan / UC Course  I have reviewed the triage vital signs and the nursing notes.  Pertinent labs & imaging results that were available during my care of the patient were reviewed by me and considered in my medical decision making (see chart for details).   All vital signs are normal and stable and patient is in no acute distress.  She states symptoms are mild and much improved over the past 2 days.  Admits to only cough, scratchy throat, and clear nasal drainage at this time.  She states she can only reproduce the chest pain if she takes a "really big deep breath."  She states she feels well.  On exam, she has mild nasal drainage and posterior pharyngeal erythema.  Chest is clear to auscultation heart regular rate and rhythm.  Chest x-ray obtained due to patient's concerns about possible pneumothorax given her history.  Chest x-ray is completely within normal limits.  Discussed results with patient.  Offered respiratory panel, but patient declined at this time.  Advised patient she likely has a viral upper respiratory infection and suggested supportive care with continuing over-the-counter cough  syrups, Tylenol or ibuprofen, increasing rest and fluids.  ED precautions reviewed for chest pain and any worsening symptoms.   Final Clinical Impressions(s) / UC Diagnoses   Final diagnoses:  Acute upper respiratory infection  Cough  Nasal congestion  Chest wall pain     Discharge Instructions     Your chest x-ray is within normal limits.  There is no sign of pneumonia, pneumothorax, or any other abnormality.  If you have a return of severe chest pain, call EMS or have someone take you to the emergency department.  CHEST PAIN RED FLAGS:  The main red flags to look out for that could mean a heart attack are; pain in the center of the chest, difficulty breathing, nausea, sweating, clammy skin, dizziness, pain radiating to the jaw/neck/arm, racing heart, etc. If any of those symptoms are present, you must immediately get to the ER.   URI/COLD SYMPTOMS: Your exam today is consistent with a viral illness. Antibiotics are not indicated at this time. Use medications as directed, including cough syrup, nasal saline, and decongestants. Your symptoms should improve over the next few days and resolve within 7-10 days. Increase rest and fluids. F/u if symptoms worsen or predominate such as sore throat, ear pain, productive cough, shortness of breath, or if you develop high fevers or worsening fatigue over the next several days.      ED Prescriptions    None     PDMP not  reviewed this encounter.   Shirlee Latch, PA-C 11/22/20 0938    Shirlee Latch, PA-C 11/22/20 [phone]

## 2020-12-05 ENCOUNTER — Ambulatory Visit
Admission: EM | Admit: 2020-12-05 | Discharge: 2020-12-05 | Disposition: A | Discharge status: Home / Self Care | Payer: HRSA Program | Attending: Family Medicine | Admitting: Family Medicine

## 2020-12-05 ENCOUNTER — Other Ambulatory Visit: Payer: Self-pay

## 2020-12-05 ENCOUNTER — Encounter: Payer: Self-pay | Admitting: Emergency Medicine

## 2020-12-05 DIAGNOSIS — R5383 Other fatigue: Secondary | ICD-10-CM | POA: Diagnosis not present

## 2020-12-05 DIAGNOSIS — U071 COVID-19: Secondary | ICD-10-CM | POA: Diagnosis present

## 2020-12-05 DIAGNOSIS — M545 Low back pain, unspecified: Secondary | ICD-10-CM | POA: Diagnosis present

## 2020-12-05 DIAGNOSIS — R059 Cough, unspecified: Secondary | ICD-10-CM | POA: Diagnosis not present

## 2020-12-05 LAB — URINALYSIS, COMPLETE (UACMP) WITH MICROSCOPIC
Glucose, UA: NEGATIVE mg/dL
Ketones, ur: >160 mg/dL — AB
Leukocytes,Ua: NEGATIVE
Nitrite: NEGATIVE
Protein, ur: 30 mg/dL — AB
Specific Gravity, Urine: >1.03 — ABNORMAL HIGH (ref 1.005–1.030)
pH: 5.5 (ref 5.0–8.0)

## 2020-12-05 LAB — RESP PANEL BY RT-PCR (FLU A&B, COVID) ARPGX2
Influenza A by PCR: NEGATIVE
Influenza B by PCR: NEGATIVE
SARS Coronavirus 2 by RT PCR: POSITIVE — AB

## 2020-12-05 MED ORDER — BENZONATATE 100 MG PO CAPS: 200.0000 mg | ORAL_CAPSULE | Freq: Three times a day (TID) | ORAL | 0 refills | Status: AC | PRN

## 2020-12-05 MED ORDER — IBUPROFEN 800 MG PO TABS: 800.0000 mg | ORAL_TABLET | Freq: Three times a day (TID) | ORAL | 0 refills | Status: AC | PRN

## 2020-12-05 MED ORDER — ACETAMINOPHEN 500 MG PO TABS
1000.0000 mg | ORAL_TABLET | Freq: Once | ORAL | Status: AC
  Administered 2020-12-05: 11:00:00 1000 mg via ORAL

## 2020-12-05 NOTE — ED Triage Notes (Signed)
Patient in today c/o cough x 2 days and back pain x 1 day. Patient denies any urinary symptoms. Patient has had the covid vaccines. Patient has taken OTC Dayquil and Nyquil for her symptoms.

## 2020-12-05 NOTE — Discharge Instructions (Addendum)
Your Covid test is positive.  You need to isolate an additional 8 days from today.  You can take ibuprofen and Tylenol for aches and pains.  You can take over-the-counter Robitussin or Mucinex for cough.  I did send Tessalon Perles which may help relieve your cough.  Increase your rest and fluid intake. Also review the COVID handout for more information.  COVID-19 INFECTION: The incubation period of COVID-19 is approximately 14 days after exposure, with most symptoms developing in roughly 4-5 days. Symptoms may range in severity from mild to critically severe. Roughly 80% of those infected will have mild symptoms. People of any age may become infected with COVID-19 and have the ability to transmit the virus. The most common symptoms include: fever, fatigue, cough, body aches, headaches, sore throat, nasal congestion, shortness of breath, nausea, vomiting, diarrhea, changes in smell and/or taste.    COURSE OF ILLNESS Some patients may begin with mild disease which can progress quickly into critical symptoms. If your symptoms are worsening please call ahead to the Emergency Department and proceed there for further treatment. Recovery time appears to be roughly 1-2 weeks for mild symptoms and 3-6 weeks for severe disease.   GO IMMEDIATELY TO ER FOR FEVER YOU ARE UNABLE TO GET DOWN WITH TYLENOL, BREATHING PROBLEMS, CHEST PAIN, FATIGUE, LETHARGY, INABILITY TO EAT OR DRINK, ETC  QUARANTINE AND ISOLATION: To help decrease the spread of COVID-19 please remain isolated if you have COVID infection or are highly suspected to have COVID infection. This means -stay home and isolate to one room in the home if you live with others. Do not share a bed or bathroom with others while ill, sanitize and wipe down all countertops and keep common areas clean and disinfected. You may discontinue isolation if you have a mild case and are asymptomatic 10 days after symptom onset as long as you have been fever free >24 hours without  having to take Motrin or Tylenol. If your case is more severe (meaning you develop pneumonia or are admitted in the hospital), you may have to isolate longer.   If you have been in close contact (within 6 feet) of someone diagnosed with COVID 19, you are advised to quarantine in your home for 14 days as symptoms can develop anywhere from 2-14 days after exposure to the virus. If you develop symptoms, you  must isolate.  Most current guidelines for COVID after exposure -isolate 10 days if you ARE NOT tested for COVID as long as symptoms do not develop -isolate 7 days if you are tested and remain asymptomatic -You do not necessarily need to be tested for COVID if you have + exposure and        develop   symptoms. Just isolate at home x10 days from symptom onset During this global pandemic, CDC advises to practice social distancing, try to stay at least 96ft away from others at all times. Wear a face covering. Wash and sanitize your hands regularly and avoid going anywhere that is not necessary.  KEEP IN MIND THAT THE COVID TEST IS NOT 100% ACCURATE AND YOU SHOULD STILL DO EVERYTHING TO PREVENT POTENTIAL SPREAD OF VIRUS TO OTHERS (WEAR MASK, WEAR GLOVES, WASH HANDS AND SANITIZE REGULARLY). IF INITIAL TEST IS NEGATIVE, THIS MAY NOT MEAN YOU ARE DEFINITELY NEGATIVE. MOST ACCURATE TESTING IS DONE 5-7 DAYS AFTER EXPOSURE.   It is not advised by CDC to get re-tested after receiving a positive COVID test since you can still test positive for weeks  to months after you have already cleared the virus.   *If you have not been vaccinated for COVID, I strongly suggest you consider getting vaccinated as long as there are no contraindications.

## 2020-12-05 NOTE — ED Provider Notes (Signed)
[facility]    CSN: [id number] Arrival date & time: 12/05/20  4268      History   Chief Complaint Chief Complaint  Patient presents with  . Back Pain  . Cough    HPI Kristen Galvan is a 34 y.o. female presenting for 2-day history of dry cough and back pain.  She also admits to fatigue and body aches.  She says she has felt feverish but has not recorded temperature at home. Denies chest pain or breathing difficulty. Patient states that her back pain is in the lower back and sometimes she feels a radiates to her legs.  Pain is worsened by certain movements and touching the left side of her lower back.  She denies any injury to the back.  She denies any associated dysuria, urinary urgency/frequency or hematuria.  Patient did start her menstrual period today.  She has been taking over-the-counter DayQuil and NyQuil for symptoms.  Patient denies any known Covid exposure.  She is fully vaccinated for COVID-19.  Patient states that she has had back pain similar to this in the past and was told that it was due to a viral illness and it eventually resolved.  Patient denies any abdominal pain, nausea/vomiting/diarrhea.  She is otherwise healthy and does not take any routine medications.  Past medical history is significant for appendectomy in 2013 and remote history of pneumothorax in 1999.  She has no other complaints or concerns today.  HPI  Past Medical History:  Diagnosis Date  . History of pneumothorax 1999    There are no problems to display for this patient.   Past Surgical History:  Procedure Laterality Date  . APPENDECTOMY  2013  . BREAST BIOPSY Left 02/13/2016   path pending  . LUNG SURGERY Left 1999    OB History    Gravida  1   Para  1   Term  1   Preterm      AB      Living  1     SAB      IAB      Ectopic      Multiple      Live Births               Home Medications    Prior to Admission medications   Medication Sig Start  Date End Date Taking? Authorizing Provider  benzonatate (TESSALON) 100 MG capsule Take 2 capsules (200 mg total) by mouth 3 (three) times daily as needed for up to 7 days for cough. 12/05/20 12/12/20 Yes Eusebio Friendly B, PA-C  ibuprofen (ADVIL) 800 MG tablet Take 1 tablet (800 mg total) by mouth every 8 (eight) hours as needed for up to 5 days. 12/05/20 12/10/20 Yes Shirlee Latch, PA-C    Family History Family History  Problem Relation Age of Onset  . Hypertension Mother   . Diabetes Father     Social History Social History   Tobacco Use  . Smoking status: Never Smoker  . Smokeless tobacco: Never Used  Vaping Use  . Vaping Use: Never used  Substance Use Topics  . Alcohol use: No    Alcohol/week: 0.0 standard drinks  . Drug use: No     Allergies   Patient has no known allergies.   Review of Systems Review of Systems  Constitutional: Positive for fatigue and fever. Negative for chills and diaphoresis.  HENT: Negative for congestion, ear pain, rhinorrhea, sinus pressure, sinus pain and sore throat.  Respiratory: Positive for cough. Negative for shortness of breath and wheezing.   Cardiovascular: Negative for chest pain.  Gastrointestinal: Negative for abdominal pain, nausea and vomiting.  Genitourinary: Positive for vaginal bleeding. Negative for difficulty urinating, dysuria, flank pain, frequency, hematuria and urgency.  Musculoskeletal: Positive for back pain and myalgias. Negative for arthralgias.  Skin: Negative for rash.  Neurological: Negative for weakness and headaches.  Hematological: Negative for adenopathy.     Physical Exam Triage Vital Signs ED Triage Vitals  Enc Vitals Group     BP 12/05/20 1048 97/81     Pulse Rate 12/05/20 1048 (!) 103     Resp 12/05/20 1048 18     Temp 12/05/20 1048 100 F (37.8 C)     Temp Source 12/05/20 1048 Oral     SpO2 12/05/20 1048 100 %     Weight 12/05/20 1050 120 lb (54.4 kg)     Height 12/05/20 1050 5\' 6"  (1.676 m)      Head Circumference --      Peak Flow --      Pain Score 12/05/20 1048 8     Pain Loc --      Pain Edu? --      Excl. in GC? --    No data found.  Updated Vital Signs BP 97/81 (BP Location: Left Arm)   Pulse (!) 103   Temp 100 F (37.8 C) (Oral)   Resp 18   Ht 5\' 6"  (1.676 m)   Wt 120 lb (54.4 kg)   LMP 12/05/2020 (Exact Date)   SpO2 100%   BMI 19.37 kg/m    Physical Exam Vitals and nursing note reviewed.  Constitutional:      General: She is not in acute distress.    Appearance: Normal appearance. She is ill-appearing. She is not toxic-appearing.  HENT:     Head: Normocephalic and atraumatic.     Nose: Nose normal.     Mouth/Throat:     Mouth: Mucous membranes are moist.     Pharynx: Oropharynx is clear.  Eyes:     General: No scleral icterus.       Right eye: No discharge.        Left eye: No discharge.     Conjunctiva/sclera: Conjunctivae normal.  Cardiovascular:     Rate and Rhythm: Regular rhythm. Tachycardia present.     Heart sounds: Normal heart sounds.  Pulmonary:     Effort: Pulmonary effort is normal. No respiratory distress.     Breath sounds: Normal breath sounds. No wheezing, rhonchi or rales.  Abdominal:     Palpations: Abdomen is soft.     Tenderness: There is no abdominal tenderness. There is no right CVA tenderness, left CVA tenderness or guarding.  Musculoskeletal:        General: Tenderness (Diffuse TTP left thoracolumbar region) present.     Cervical back: Neck supple.  Skin:    General: Skin is dry.  Neurological:     General: No focal deficit present.     Mental Status: She is alert. Mental status is at baseline.     Motor: No weakness.     Gait: Gait normal.  Psychiatric:        Mood and Affect: Mood normal.        Behavior: Behavior normal.        Thought Content: Thought content normal.      UC Treatments / Results  Labs (all labs ordered are listed, but only abnormal  results are displayed) Labs Reviewed  RESP PANEL BY  RT-PCR (FLU A&B, COVID) ARPGX2 - Abnormal; Notable for the following components:      Result Value   SARS Coronavirus 2 by RT PCR POSITIVE (*)    All other components within normal limits  URINALYSIS, COMPLETE (UACMP) WITH MICROSCOPIC - Abnormal; Notable for the following components:   APPearance CLOUDY (*)    Specific Gravity, Urine >1.030 (*)    Hgb urine dipstick LARGE (*)    Bilirubin Urine SMALL (*)    Ketones, ur >160 (*)    Protein, ur 30 (*)    Bacteria, UA FEW (*)    All other components within normal limits    EKG   Radiology No results found.  Procedures Procedures (including critical care time)  Medications Ordered in UC Medications  acetaminophen (TYLENOL) tablet 1,000 mg (1,000 mg Oral Given 12/05/20 1123)    Initial Impression / Assessment and Plan / UC Course  I have reviewed the triage vital signs and the nursing notes.  Pertinent labs & imaging results that were available during my care of the patient were reviewed by me and considered in my medical decision making (see chart for details).   34 year old female with 2-day history of cough and back pain.  Temperature elevated 100 degrees.  Pulse elevated at 103.  Patient is ill-appearing but not toxic.  Respiratory panel obtained and patient positive COVID-19.  Urinalysis obtained due to complaint of back pain.  Urinalysis does show specific gravity greater than 1.030 and increased ketones and protein.  Patient states that she has not eaten in a couple of days since she has felt bad.  Discussed the importance of increasing her fluids and eating.  CDC guidelines, isolation protocol and ED precautions reviewed since she is Covid positive.  I sent Tessalon Perles and ibuprofen to the pharmacy.  Advised throat lozenges and nasal sprays too if needed.  ED precautions thoroughly reviewed with patient.  Work note provided.  Final Clinical Impressions(s) / UC Diagnoses   Final diagnoses:  COVID-19  Acute  left-sided low back pain without sciatica  Cough  Fatigue, unspecified type     Discharge Instructions     Your Covid test is positive.  You need to isolate an additional 8 days from today.  You can take ibuprofen and Tylenol for aches and pains.  You can take over-the-counter Robitussin or Mucinex for cough.  I did send Tessalon Perles which may help relieve your cough.  Increase your rest and fluid intake. Also review the COVID handout for more information.  COVID-19 INFECTION: The incubation period of COVID-19 is approximately 14 days after exposure, with most symptoms developing in roughly 4-5 days. Symptoms may range in severity from mild to critically severe. Roughly 80% of those infected will have mild symptoms. People of any age may become infected with COVID-19 and have the ability to transmit the virus. The most common symptoms include: fever, fatigue, cough, body aches, headaches, sore throat, nasal congestion, shortness of breath, nausea, vomiting, diarrhea, changes in smell and/or taste.    COURSE OF ILLNESS Some patients may begin with mild disease which can progress quickly into critical symptoms. If your symptoms are worsening please call ahead to the Emergency Department and proceed there for further treatment. Recovery time appears to be roughly 1-2 weeks for mild symptoms and 3-6 weeks for severe disease.   GO IMMEDIATELY TO ER FOR FEVER YOU ARE UNABLE TO GET DOWN WITH TYLENOL, BREATHING PROBLEMS,  CHEST PAIN, FATIGUE, LETHARGY, INABILITY TO EAT OR DRINK, ETC  QUARANTINE AND ISOLATION: To help decrease the spread of COVID-19 please remain isolated if you have COVID infection or are highly suspected to have COVID infection. This means -stay home and isolate to one room in the home if you live with others. Do not share a bed or bathroom with others while ill, sanitize and wipe down all countertops and keep common areas clean and disinfected. You may discontinue isolation if you have a  mild case and are asymptomatic 10 days after symptom onset as long as you have been fever free >24 hours without having to take Motrin or Tylenol. If your case is more severe (meaning you develop pneumonia or are admitted in the hospital), you may have to isolate longer.   If you have been in close contact (within 6 feet) of someone diagnosed with COVID 19, you are advised to quarantine in your home for 14 days as symptoms can develop anywhere from 2-14 days after exposure to the virus. If you develop symptoms, you  must isolate.  Most current guidelines for COVID after exposure -isolate 10 days if you ARE NOT tested for COVID as long as symptoms do not develop -isolate 7 days if you are tested and remain asymptomatic -You do not necessarily need to be tested for COVID if you have + exposure and        develop   symptoms. Just isolate at home x10 days from symptom onset During this global pandemic, CDC advises to practice social distancing, try to stay at least 40ft away from others at all times. Wear a face covering. Wash and sanitize your hands regularly and avoid going anywhere that is not necessary.  KEEP IN MIND THAT THE COVID TEST IS NOT 100% ACCURATE AND YOU SHOULD STILL DO EVERYTHING TO PREVENT POTENTIAL SPREAD OF VIRUS TO OTHERS (WEAR MASK, WEAR GLOVES, WASH HANDS AND SANITIZE REGULARLY). IF INITIAL TEST IS NEGATIVE, THIS MAY NOT MEAN YOU ARE DEFINITELY NEGATIVE. MOST ACCURATE TESTING IS DONE 5-7 DAYS AFTER EXPOSURE.   It is not advised by CDC to get re-tested after receiving a positive COVID test since you can still test positive for weeks to months after you have already cleared the virus.   *If you have not been vaccinated for COVID, I strongly suggest you consider getting vaccinated as long as there are no contraindications.      ED Prescriptions    Medication Sig Dispense Auth. Provider   ibuprofen (ADVIL) 800 MG tablet Take 1 tablet (800 mg total) by mouth every 8 (eight) hours as  needed for up to 5 days. 15 tablet Eusebio Friendly B, PA-C   benzonatate (TESSALON) 100 MG capsule Take 2 capsules (200 mg total) by mouth 3 (three) times daily as needed for up to 7 days for cough. 21 capsule Shirlee Latch, PA-C     PDMP not reviewed this encounter.   Shirlee Latch, PA-C 12/05/20 1223

## 2021-01-31 ENCOUNTER — Ambulatory Visit
Admission: EM | Admit: 2021-01-31 | Discharge: 2021-01-31 | Disposition: A | Discharge status: Home / Self Care | Payer: Medicaid Other | Attending: Physician Assistant | Admitting: Physician Assistant

## 2021-01-31 ENCOUNTER — Other Ambulatory Visit: Payer: Self-pay

## 2021-01-31 ENCOUNTER — Encounter: Payer: Self-pay | Admitting: Emergency Medicine

## 2021-01-31 DIAGNOSIS — S39012A Strain of muscle, fascia and tendon of lower back, initial encounter: Secondary | ICD-10-CM

## 2021-01-31 MED ORDER — CYCLOBENZAPRINE HCL 10 MG PO TABS: 10.0000 mg | ORAL_TABLET | Freq: Three times a day (TID) | ORAL | 0 refills | Status: AC | PRN

## 2021-01-31 MED ORDER — NAPROXEN 500 MG PO TABS: 500.0000 mg | ORAL_TABLET | Freq: Two times a day (BID) | ORAL | 0 refills | Status: AC

## 2021-01-31 NOTE — ED Triage Notes (Addendum)
Patient in today c/o right sided back pain x 2 days. Patient states she was sitting in a chair in an uncomfortable position for 15 minutes. Patient denies any urinary symptoms. Patient took Ibuprofen 600 mg at ~8am.

## 2021-01-31 NOTE — Discharge Instructions (Addendum)

## 2021-01-31 NOTE — ED Provider Notes (Signed)
[facility]    CSN: [id number] Arrival date & time: 01/31/21  1156      History   Chief Complaint Chief Complaint  Patient presents with  . Back Pain    HPI Kristen Galvan is a 35 y.o. female presenting for right-sided back pain x2 to 3 days.  Patient states that she started having the pain after she was sitting awkwardly in a chair.  Patient states that whenever she sat back up into a straight position she felt a pain in her back that has persisted.  Denies any trauma to her back.  No history of back problems.  She says the pain is constant.  It does not radiate.  It is made worse by bending forward and twisting.  No associated numbness, weakness or tingling.  No fever, cough or shortness of breath.  No dysuria, urinary frequency or hematuria.  Patient has taken ibuprofen with slight improvement in symptoms, but not total relief.  She has no other complaints or concerns today.  HPI  Past Medical History:  Diagnosis Date  . History of pneumothorax 1999    There are no problems to display for this patient.   Past Surgical History:  Procedure Laterality Date  . APPENDECTOMY  2013  . BREAST BIOPSY Left 02/13/2016   path pending  . LUNG SURGERY Left 1999    OB History    Gravida  1   Para  1   Term  1   Preterm      AB      Living  1     SAB      IAB      Ectopic      Multiple      Live Births               Home Medications    Prior to Admission medications   Medication Sig Start Date End Date Taking? Authorizing Provider  cyclobenzaprine (FLEXERIL) 10 MG tablet Take 1 tablet (10 mg total) by mouth 3 (three) times daily as needed for up to 7 days for muscle spasms. 01/31/21 02/07/21 Yes Eusebio Friendly B, PA-C  naproxen (NAPROSYN) 500 MG tablet Take 1 tablet (500 mg total) by mouth 2 (two) times daily for 7 days. 01/31/21 02/07/21 Yes Shirlee Latch, PA-C    Family History Family History  Problem Relation Age of Onset  . Hypertension  Mother   . Diabetes Father     Social History Social History   Tobacco Use  . Smoking status: Never Smoker  . Smokeless tobacco: Never Used  Vaping Use  . Vaping Use: Some days  . Substances: CBD, Flavoring  Substance Use Topics  . Alcohol use: No    Alcohol/week: 0.0 standard drinks  . Drug use: No     Allergies   Penicillins   Review of Systems Review of Systems  Constitutional: Negative for fatigue and fever.  Respiratory: Negative for cough and shortness of breath.   Cardiovascular: Negative for chest pain.  Gastrointestinal: Negative for abdominal pain, nausea and vomiting.  Genitourinary: Negative for dysuria, frequency and vaginal discharge.  Musculoskeletal: Positive for back pain. Negative for arthralgias.  Skin: Negative for rash and wound.  Neurological: Negative for weakness and numbness.     Physical Exam Triage Vital Signs ED Triage Vitals  Enc Vitals Group     BP 01/31/21 1207 113/76     Pulse Rate 01/31/21 1207 93     Resp 01/31/21 1207  18     Temp 01/31/21 1207 98.2 F (36.8 C)     Temp Source 01/31/21 1207 Oral     SpO2 01/31/21 1207 100 %     Weight 01/31/21 1208 118 lb (53.5 kg)     Height 01/31/21 1208 5\' 6"  (1.676 m)     Head Circumference --      Peak Flow --      Pain Score 01/31/21 1207 7     Pain Loc --      Pain Edu? --      Excl. in GC? --    No data found.  Updated Vital Signs BP 113/76 (BP Location: Left Arm)   Pulse 93   Temp 98.2 F (36.8 C) (Oral)   Resp 18   Ht 5\' 6"  (1.676 m)   Wt 118 lb (53.5 kg)   LMP 01/29/2021 (Exact Date)   SpO2 100%   BMI 19.05 kg/m        Physical Exam Vitals and nursing note reviewed.  Constitutional:      General: She is not in acute distress.    Appearance: Normal appearance. She is not ill-appearing or toxic-appearing.  HENT:     Head: Normocephalic and atraumatic.  Eyes:     General: No scleral icterus.       Right eye: No discharge.        Left eye: No discharge.      Conjunctiva/sclera: Conjunctivae normal.  Cardiovascular:     Rate and Rhythm: Normal rate and regular rhythm.     Heart sounds: Normal heart sounds.  Pulmonary:     Effort: Pulmonary effort is normal. No respiratory distress.     Breath sounds: Normal breath sounds.  Abdominal:     Palpations: Abdomen is soft.     Tenderness: There is no right CVA tenderness or left CVA tenderness.  Musculoskeletal:     Cervical back: Neck supple.     Lumbar back: Tenderness (Diffuse TTP upper right lumbar paravertebral muscles) present. No bony tenderness. Normal range of motion. Negative right straight leg raise test and negative left straight leg raise test.     Comments: Painful forward flexion, rotation to right and lateral flexion on right. Also painful extension of back  Skin:    General: Skin is dry.  Neurological:     General: No focal deficit present.     Mental Status: She is alert. Mental status is at baseline.     Motor: No weakness.     Gait: Gait normal.  Psychiatric:        Mood and Affect: Mood normal.        Behavior: Behavior normal.        Thought Content: Thought content normal.      UC Treatments / Results  Labs (all labs ordered are listed, but only abnormal results are displayed) Labs Reviewed - No data to display  EKG   Radiology No results found.  Procedures Procedures (including critical care time)  Medications Ordered in UC Medications - No data to display  Initial Impression / Assessment and Plan / UC Course  I have reviewed the triage vital signs and the nursing notes.  Pertinent labs & imaging results that were available during my care of the patient were reviewed by me and considered in my medical decision making (see chart for details).   Patient's exam consistent with musculoskeletal type back pain.  No red flag signs or symptoms and normal vital signs.  Suspect possible strain and or muscle spasms.  Treating conservatively at this time with  naproxen and cyclobenzaprine.  Also encourage stretching and use of heat.  I reviewed ED precautions for back pain with patient.  Final Clinical Impressions(s) / UC Diagnoses   Final diagnoses:  Strain of lumbar region, initial encounter     Discharge Instructions     BACK PAIN: Stressed avoiding painful activities . RICE (REST, ICE, COMPRESSION, ELEVATION) guidelines reviewed. May alternate ice and heat. Consider use of muscle rubs, Salonpas patches, etc. Use medications as directed including muscle relaxers if prescribed. Take anti-inflammatory medications as prescribed or OTC NSAIDs/Tylenol.  F/u with PCP in 7-10 days for reexamination, and please feel free to call or return to the urgent care at any time for any questions or concerns you may have and we will be happy to help you!   BACK PAIN RED FLAGS: If the back pain acutely worsens or there are any red flag symptoms such as numbness/tingling, leg weakness, saddle anesthesia, or loss of bowel/bladder control, go immediately to the ER. Follow up with [place] as scheduled or sooner if the pain does not begin to resolve or if it worsens before the follow up      ED Prescriptions    Medication Sig Dispense Auth. Provider   naproxen (NAPROSYN) 500 MG tablet Take 1 tablet (500 mg total) by mouth 2 (two) times daily for 7 days. 14 tablet Eusebio Friendly B, PA-C   cyclobenzaprine (FLEXERIL) 10 MG tablet Take 1 tablet (10 mg total) by mouth 3 (three) times daily as needed for up to 7 days for muscle spasms. 20 tablet Gareth Morgan     PDMP not reviewed this encounter.   Shirlee Latch, PA-C 01/31/21 1301

## 2021-12-26 ENCOUNTER — Other Ambulatory Visit: Payer: Self-pay

## 2021-12-26 ENCOUNTER — Ambulatory Visit (LOCAL_COMMUNITY_HEALTH_CENTER): Payer: Medicaid Other | Admitting: Nurse Practitioner

## 2021-12-26 ENCOUNTER — Encounter: Payer: Self-pay | Admitting: Family Medicine

## 2021-12-26 VITALS — BP 101/68 | Ht 66.0 in | Wt 131.8 lb

## 2021-12-26 DIAGNOSIS — Z01419 Encounter for gynecological examination (general) (routine) without abnormal findings: Secondary | ICD-10-CM | POA: Diagnosis not present

## 2021-12-26 DIAGNOSIS — Z3009 Encounter for other general counseling and advice on contraception: Secondary | ICD-10-CM

## 2021-12-26 DIAGNOSIS — N760 Acute vaginitis: Secondary | ICD-10-CM

## 2021-12-26 DIAGNOSIS — B9689 Other specified bacterial agents as the cause of diseases classified elsewhere: Secondary | ICD-10-CM

## 2021-12-26 LAB — WET PREP FOR TRICH, YEAST, CLUE
Trichomonas Exam: NEGATIVE
Yeast Exam: NEGATIVE

## 2021-12-26 MED ORDER — METRONIDAZOLE 500 MG PO TABS: 500.0000 mg | ORAL_TABLET | Freq: Two times a day (BID) | ORAL | 0 refills | Status: AC

## 2021-12-26 MED ORDER — NORGESTIM-ETH ESTRAD TRIPHASIC 0.18/0.215/0.25 MG-25 MCG PO TABS: 1.0000 | ORAL_TABLET | Freq: Every day | ORAL | 1 refills | Status: DC

## 2021-12-26 NOTE — Progress Notes (Signed)
Pt here for PE, Pap and BC.  Wet mount results reviewed and medication dispensed per Provider orders.  IUD insertion scheduled for 2/2, consultation completed and questions answered.  Non latex condoms given to pt.  Berdie Ogren, RN

## 2021-12-26 NOTE — Progress Notes (Signed)
[facility] [address] Main Number: [phone]    Family Planning Visit- Initial Visit  Subjective:  Kristen Galvan is a 36 y.o.  G2P1011   being seen today for an initial annual visit and to discuss reproductive life planning.  The patient is currently using Female Condom for pregnancy prevention. Patient reports   does not want a pregnancy in the next year.  Patient has the following medical conditions does not have a problem list on file.  Chief Complaint  Patient presents with   Annual Exam   Contraception    Options     Patient reports to clinic today for a physical and to restart birth control.    Patient denies any current signs or symptoms.     Body mass index is 21.27 kg/m. - Patient is eligible for diabetes screening based on BMI and age 72>40?  not applicable HA1C ordered? not applicable  Patient reports 1  partner/s in last year. Desires STI screening?  Yes  Has patient been screened once for HCV in the past?  No, patient declines today  No results found for: HCVAB  Does the patient have current drug use (including MJ), have a partner with drug use, and/or has been incarcerated since last result? Yes  If yes-- Screen [facility] HCV [facility]   Does the patient meet criteria for HBV testing? Yes, patient declines   Criteria:  -Household, sexual or needle sharing contact with HBV -History of drug use -HIV positive -Those with known Hep C   Health Maintenance Due  Topic Date Due   HIV Screening  Never done   Hepatitis C Screening  Never done   TETANUS/TDAP  Never done   PAP SMEAR-Modifier  Never done   COVID-19 Vaccine (3 - Booster for Pfizer series) 07/03/2020   INFLUENZA VACCINE  Never done    Review of Systems  Constitutional:  Negative for chills, fever, malaise/fatigue and weight loss.  HENT:  Negative for congestion, hearing loss and sore throat.   Eyes:  Negative for blurred  vision, double vision and photophobia.  Respiratory:  Negative for shortness of breath.   Cardiovascular:  Negative for chest pain.  Gastrointestinal:  Negative for abdominal pain, blood in stool, constipation, diarrhea, heartburn, nausea and vomiting.  Genitourinary:  Negative for dysuria and frequency.  Musculoskeletal:  Negative for back pain, joint pain and neck pain.  Skin:  Negative for itching and rash.  Neurological:  Negative for dizziness, weakness and headaches.  Endo/Heme/Allergies:  Does not bruise/bleed easily.  Psychiatric/Behavioral:  Negative for depression, substance abuse and suicidal ideas.    The following portions of the patient's history were reviewed and updated as appropriate: allergies, current medications, past family history, past medical history, past social history, past surgical history and problem list. Problem list updated.   See flowsheet for other program required questions.  Objective:   Vitals:   12/26/21 1009  BP: 101/68  Weight: 131 lb 12.8 oz (59.8 kg)  Height: 5\' 6"  (1.676 m)    Physical Exam Constitutional:      Appearance: Normal appearance.  HENT:     Head: Normocephalic.     Right Ear: Tympanic membrane normal.     Left Ear: Tympanic membrane normal.     Mouth/Throat:     Mouth: Mucous membranes are moist.     Comments: No visible signs of dental caries.  Last dental visit 5 months ago.  Eyes:  Pupils: Pupils are equal, round, and reactive to light.  Cardiovascular:     Rate and Rhythm: Normal rate and regular rhythm.  Pulmonary:     Effort: Pulmonary effort is normal.     Breath sounds: Normal breath sounds.  Chest:     Comments: Breasts:        Right: Normal. No swelling, mass, nipple discharge, skin change or tenderness.        Left: Normal. No swelling, mass, nipple discharge, skin change or tenderness.   Abdominal:     General: Abdomen is flat. Bowel sounds are normal.     Palpations: Abdomen is soft.   Genitourinary:    Comments: External genitalia/pubic area without nits, lice, edema, erythema, lesions and inguinal adenopathy. Vagina with normal mucosa and discharge. Cervix without visible lesions. Uterus firm, mobile, nt, no masses, no CMT, no adnexal tenderness or fullness. pH 4.5. Musculoskeletal:     Cervical back: Full passive range of motion without pain, normal range of motion and neck supple.  Skin:    General: Skin is warm and dry.  Neurological:     Mental Status: She is alert and oriented to person, place, and time.  Psychiatric:        Attention and Perception: Attention normal.        Mood and Affect: Mood normal.        Behavior: Behavior is cooperative.      Assessment and Plan:  Kristen Galvan is a 36 y.o. female presenting to the [facility] Department for an initial annual wellness/contraceptive visit  Contraception counseling: Reviewed all forms of birth control options in the tiered based approach. available including abstinence; over the counter/barrier methods; hormonal contraceptive medication including pill, patch, ring, injection,contraceptive implant, ECP; hormonal and nonhormonal IUDs; permanent sterilization options including vasectomy and the various tubal sterilization modalities. Risks, benefits, and typical effectiveness rates were reviewed.  Questions were answered.  Written information was also given to the patient to review.  Patient desires IUD or IUS, this was prescribed for patient.    The patient will follow up in  1 year for surveillance.  The patient was told to call with any further questions, or with any concerns about this method of contraception.  Emphasized use of condoms 100% of the time for STI prevention.  Patient was not offered ECP based on date of last sex..   1. Family planning counseling -36 year old female in clinic for a physical and birth control method.  -Patient desires to have a Mirena as her birth  control method.  Next available appointment scheduled for 01/09/22. -Please complete IUD counseling/questionnaire.   -Instructed the patient to take a pregnancy test in 2 weeks, if positive instructed to call the health department. -Use condoms as a back-up method for 2 weeks.  -Will prescribe patient Tri-Sprintec 1 PO daily for coverage until IUD placement with 1 refill.   -Patient accepted all screenings including vaginal CT/GC and declines bloodwork for HIV/RPR.  Patient meets criteria for HepB screening? Yes. Ordered? No - patient declines  Patient meets criteria for HepC screening? Yes. Ordered? No - patient declines    Discussed time line for [facility] results and that patient will be called with positive results and encouraged patient to call if she had not heard in 2 weeks.  Counseled to return or seek care for continued or worsening symptoms Recommended condom use with all sex  Patient is currently using  condoms   to prevent  pregnancy.    - WET PREP FOR TRICH, YEAST, CLUE - Chlamydia/Gonorrhea [facility] - Norgestimate-Ethinyl Estradiol Triphasic 0.18/0.215/0.25 MG-25 MCG tab; Take 1 tablet by mouth daily.  Dispense: 28 tablet; Refill: 1  2. Well woman exam -Well woman exam today. -CBE today, next due 12/2024 -PAP performed today.  Will await results.  - IGP, Aptima HPV   3. Bacterial vaginosis -Wet mount reviewed, please treat patient with Metronidazole 500 MG PO BID x 7 days.  - metroNIDAZOLE (FLAGYL) 500 MG tablet; Take 1 tablet (500 mg total) by mouth 2 (two) times daily for 7 days.  Dispense: 14 tablet; Refill: 0       Return in about 1 year (around 12/26/2022) for Annual well-woman exam.  Future Appointments  Date Time Provider Department Center  01/09/2022 11:00 AM [facility] PROVIDER [facility] None    Glenna Fellows, FNP

## 2021-12-31 LAB — IGP, APTIMA HPV
HPV Aptima: NEGATIVE
PAP Smear Comment: 0

## 2022-01-09 ENCOUNTER — Ambulatory Visit (LOCAL_COMMUNITY_HEALTH_CENTER): Payer: Medicaid Other | Admitting: Family Medicine

## 2022-01-09 ENCOUNTER — Other Ambulatory Visit: Payer: Self-pay

## 2022-01-09 VITALS — BP 104/65 | HR 57 | Temp 98.5°F | Ht 66.0 in | Wt 130.8 lb

## 2022-01-09 DIAGNOSIS — Z30430 Encounter for insertion of intrauterine contraceptive device: Secondary | ICD-10-CM | POA: Diagnosis not present

## 2022-01-09 DIAGNOSIS — Z3009 Encounter for other general counseling and advice on contraception: Secondary | ICD-10-CM

## 2022-01-09 DIAGNOSIS — Z3202 Encounter for pregnancy test, result negative: Secondary | ICD-10-CM | POA: Diagnosis not present

## 2022-01-09 LAB — PREGNANCY, URINE: Preg Test, Ur: NEGATIVE

## 2022-01-09 MED ORDER — LEVONORGESTREL 20 MCG/DAY IU IUD
1.0000 | INTRAUTERINE_SYSTEM | Freq: Once | INTRAUTERINE | Status: AC
  Administered 2022-01-09: 1 via INTRAUTERINE

## 2022-01-09 NOTE — Progress Notes (Signed)
UPT negative and reviewed by Dr. Alvester Morin. Jossie Ng, RN

## 2022-01-09 NOTE — Progress Notes (Signed)
[facility] [address] Main Number: [phone]  Contraception/Family Planning VISIT ENCOUNTER NOTE  Subjective:   Kristen Galvan is a 36 y.o. G42P1011 female here for reproductive life counseling. The patient is currently using Oral Contraceptive to prevent pregnancy.   The patient does not want a pregnancy in the next year.  She was seen on 1/19 and started on OCP. She has had sex without a condom.   Denies abnormal vaginal bleeding, discharge, pelvic pain, problems with intercourse or other gynecologic concerns.    Gynecologic History Patient's last menstrual period was 12/17/2021 (exact date).  Health Maintenance Due  Topic Date Due   HIV Screening  Never done   Hepatitis C Screening  Never done   TETANUS/TDAP  Never done   COVID-19 Vaccine (3 - Booster for Pfizer series) 07/03/2020   INFLUENZA VACCINE  Never done    The following portions of the patient's history were reviewed and updated as appropriate: allergies, current medications, past family history, past medical history, past social history, past surgical history and problem list.  Review of Systems Pertinent items are noted in HPI.   Objective:  BP 104/65    Pulse (!) 57    Temp 98.5 F (36.9 C)    Ht 5\' 6"  (1.676 m)    Wt 130 lb 12.8 oz (59.3 kg)    LMP 12/17/2021 (Exact Date)    BMI 21.11 kg/m  Gen: well appearing, NAD HEENT: no scleral icterus CV: RR Lung: Normal WOB Ext: warm well perfused  Patient presented to [facility] for IUD insertion. Her GC/CT screening was found to be up to date and using WHO criteria we can be reasonably certain she is not pregnant or a pregnancy test was obtained which was Urine pregnancy test  today was Negative.  See Flowsheet for IUD check list  IUD Insertion Procedure Note Patient identified, informed consent performed, consent signed.   Discussed risks of irregular bleeding, cramping, infection, malpositioning or  misplacement of the IUD outside the uterus which may require further procedure such as laparoscopy. Time out was performed.    Speculum placed in the vagina.  Cervix visualized.  Cleaned with Betadine x 2.  Grasped anteriorly with a single tooth tenaculum.  Uterus sounded to 8cm cm.  IUD placed per manufacturer's recommendations.  Strings trimmed to 3 cm. Tenaculum was removed, good hemostasis noted.  Patient tolerated procedure well.    Assessment and Plan:   1. Family planning Patient was given post-procedure instructions- both agency handout and verbally by provider.  She was advised to have backup contraception for one week.  Patient was also asked to check IUD strings periodically or follow up in 4 weeks for IUD check. - Recommended home UPT in 2 weeks for reassurance - Pregnancy, urine - levonorgestrel (MIRENA) 20 MCG/DAY IUD 1 each   Please refer to After Visit Summary for other counseling recommendations.   Return in about 4 weeks (around 02/06/2022) for IUD string check.  No future appointments.   04/08/2022, MD [facility] DEPARTMENT

## 2022-05-02 ENCOUNTER — Ambulatory Visit
Admission: EM | Admit: 2022-05-02 | Discharge: 2022-05-02 | Disposition: A | Discharge status: Home / Self Care | Payer: Medicaid Other | Attending: Internal Medicine | Admitting: Internal Medicine

## 2022-05-02 DIAGNOSIS — S39012A Strain of muscle, fascia and tendon of lower back, initial encounter: Secondary | ICD-10-CM

## 2022-05-02 DIAGNOSIS — S29019A Strain of muscle and tendon of unspecified wall of thorax, initial encounter: Secondary | ICD-10-CM

## 2022-05-02 MED ORDER — MELOXICAM 7.5 MG PO TABS: 7.5000 mg | ORAL_TABLET | Freq: Every day | ORAL | 0 refills | Status: AC

## 2022-05-02 MED ORDER — BACLOFEN 10 MG PO TABS: 10.0000 mg | ORAL_TABLET | Freq: Three times a day (TID) | ORAL | 0 refills | Status: AC

## 2022-05-02 NOTE — Discharge Instructions (Signed)
Use ice for 48 hours 3-4 times a day each day for 20 min at a time, then alternate with heat after that for 5 more days

## 2022-05-02 NOTE — ED Triage Notes (Signed)
Patient c.o "back pain shooting up her back and is starting to spread to her stomach"  Patient denies any pain when urinating.

## 2022-05-02 NOTE — ED Provider Notes (Addendum)
[facility]    CSN: [id number] Arrival date & time: 05/02/22  1019      History   Chief Complaint No chief complaint on file.   HPI Kristen Galvan is a 36 y.o. female who presents with low back pain radiating to her upper back  since this am when she was sitting on the floor tying her son's shoes. Denies UTI symptoms. She took 2 tylenol's and has not helped. Denies hx of disc issues.     Past Medical History:  Diagnosis Date   History of pneumothorax 1999    There are no problems to display for this patient.   Past Surgical History:  Procedure Laterality Date   APPENDECTOMY  12/09/2011   BREAST BIOPSY Left 02/13/2016   path pending   CESAREAN SECTION     LUNG SURGERY Left 12/08/1997    OB History     Gravida  2   Para  1   Term  1   Preterm      AB  1   Living  1      SAB      IAB      Ectopic      Multiple      Live Births               Home Medications    Prior to Admission medications   Medication Sig Start Date End Date Taking? Authorizing Provider  baclofen (LIORESAL) 10 MG tablet Take 1 tablet (10 mg total) by mouth 3 (three) times daily. 05/02/22  Yes Rodriguez-Southworth, Nettie Elm, PA-C  meloxicam (MOBIC) 7.5 MG tablet Take 1 tablet (7.5 mg total) by mouth daily. 05/02/22  Yes Rodriguez-Southworth, Nettie Elm, PA-C    Family History Family History  Problem Relation Age of Onset   Hypertension Mother    Diabetes Father    Cancer Maternal Grandmother    Dementia Maternal Aunt     Social History Social History   Tobacco Use   Smoking status: Never   Smokeless tobacco: Never  Vaping Use   Vaping Use: Former   Substances: CBD, Flavoring  Substance Use Topics   Alcohol use: Yes    Comment: Socially   Drug use: Yes    Types: Marijuana     Allergies   Penicillins   Review of Systems Review of Systems  Constitutional:  Negative for fever.  Gastrointestinal:  Negative for abdominal pain.   Musculoskeletal:  Positive for back pain.  Skin:  Negative for rash and wound.  Neurological:  Negative for weakness and numbness.    Physical Exam Triage Vital Signs ED Triage Vitals  Enc Vitals Group     BP 05/02/22 1035 105/81     Pulse Rate 05/02/22 1035 (!) 56     Resp 05/02/22 1035 18     Temp 05/02/22 1035 98.6 F (37 C)     Temp src --      SpO2 05/02/22 1035 97 %     Weight 05/02/22 1034 127 lb (57.6 kg)     Height 05/02/22 1034 5\' 6"  (1.676 m)     Head Circumference --      Peak Flow --      Pain Score 05/02/22 1034 8     Pain Loc --      Pain Edu? --      Excl. in GC? --    No data found.  Updated Vital Signs BP 105/81 (BP Location: Left Arm)   Pulse 05/04/22)  56   Temp 98.6 F (37 C)   Resp 18   Ht 5\' 6"  (1.676 m)   Wt 127 lb (57.6 kg)   LMP 04/25/2022 (Approximate)   SpO2 97%   BMI 20.50 kg/m   Visual Acuity Right Eye Distance:   Left Eye Distance:   Bilateral Distance:    Right Eye Near:   Left Eye Near:    Bilateral Near:     Physical Exam Vitals and nursing note reviewed.  Constitutional:      General: She is not in acute distress.    Appearance: She is normal weight. She is not toxic-appearing.  HENT:     Right Ear: External ear normal.     Left Ear: External ear normal.  Eyes:     General: No scleral icterus.    Conjunctiva/sclera: Conjunctivae normal.  Pulmonary:     Effort: Pulmonary effort is normal.  Abdominal:     General: Bowel sounds are normal.     Palpations: Abdomen is soft. There is no mass.     Tenderness: There is no abdominal tenderness.  Musculoskeletal:        General: Normal range of motion.     Cervical back: Neck supple.     Comments: BACK- has muscular tenderness and tension on L upper and lower thoracic region, ROM of lumbar spine and thoracic rotation provoked her pain. Neg SLR.   Skin:    General: Skin is warm and dry.     Findings: No rash.  Neurological:     Mental Status: She is alert and oriented to  person, place, and time.     Gait: Gait normal.     Deep Tendon Reflexes: Reflexes normal.  Psychiatric:        Mood and Affect: Mood normal.        Behavior: Behavior normal.        Thought Content: Thought content normal.        Judgment: Judgment normal.     UC Treatments / Results  Labs (all labs ordered are listed, but only abnormal results are displayed) Labs Reviewed - No data to display  EKG   Radiology No results found.  Procedures Procedures (including critical care time)  Medications Ordered in UC Medications - No data to display  Initial Impression / Assessment and Plan / UC Course  I have reviewed the triage vital signs and the nursing notes.  Lumbar strain  She was placed on Baclofen and Mobic as noted. See instructions.  Final Clinical Impressions(s) / UC Diagnoses   Final diagnoses:  Lumbar strain, initial encounter  Thoracic myofascial strain, initial encounter     Discharge Instructions      Use ice for 48 hours 3-4 times a day each day for 20 min at a time, then alternate with heat after that for 5 more days     ED Prescriptions     Medication Sig Dispense Auth. Provider   meloxicam (MOBIC) 7.5 MG tablet Take 1 tablet (7.5 mg total) by mouth daily. 20 tablet Rodriguez-Southworth, 04/27/2022, PA-C   baclofen (LIORESAL) 10 MG tablet Take 1 tablet (10 mg total) by mouth 3 (three) times daily. 30 each Rodriguez-Southworth, Nettie Elm, PA-C      PDMP not reviewed this encounter.   Nettie Elm, PA-C 05/02/22 1109    Rodriguez-Southworth, [place], PA-C 05/02/22 1128

## 2023-12-08 ENCOUNTER — Encounter: Payer: Self-pay | Admitting: Nurse Practitioner

## 2023-12-08 ENCOUNTER — Ambulatory Visit: Payer: Medicaid Other | Admitting: Nurse Practitioner

## 2023-12-08 ENCOUNTER — Ambulatory Visit: Payer: Medicaid Other

## 2023-12-08 VITALS — BP 111/73 | Ht 66.0 in | Wt 130.0 lb

## 2023-12-08 DIAGNOSIS — Z309 Encounter for contraceptive management, unspecified: Secondary | ICD-10-CM

## 2023-12-08 DIAGNOSIS — Z113 Encounter for screening for infections with a predominantly sexual mode of transmission: Secondary | ICD-10-CM

## 2023-12-08 DIAGNOSIS — Z3009 Encounter for other general counseling and advice on contraception: Secondary | ICD-10-CM

## 2023-12-08 DIAGNOSIS — Z01419 Encounter for gynecological examination (general) (routine) without abnormal findings: Secondary | ICD-10-CM | POA: Diagnosis not present

## 2023-12-08 DIAGNOSIS — B9689 Other specified bacterial agents as the cause of diseases classified elsewhere: Secondary | ICD-10-CM

## 2023-12-08 LAB — WET PREP FOR TRICH, YEAST, CLUE
Trichomonas Exam: NEGATIVE
Yeast Exam: NEGATIVE

## 2023-12-08 LAB — HEPATITIS B SURFACE ANTIGEN

## 2023-12-08 LAB — HM HIV SCREENING LAB: HM HIV Screening: NEGATIVE

## 2023-12-08 LAB — HM HEPATITIS C SCREENING LAB: HM Hepatitis Screen: NEGATIVE

## 2023-12-08 MED ORDER — METRONIDAZOLE 500 MG PO TABS: 500.0000 mg | ORAL_TABLET | Freq: Two times a day (BID) | ORAL | Status: AC

## 2023-12-08 NOTE — Progress Notes (Signed)
 [facility] [address] [place] [address] Main phone: [phone]  Family Planning Visit - Repeat Yearly Visit  Subjective:  Kristen Galvan is a 37 y.o. G2P1011  being seen today for an annual wellness visit and to discuss contraception options.   The patient is currently using IUD or IUS for pregnancy prevention. Patient does not want a pregnancy in the next year.   Patient reports they are looking for a method that provides High efficacy at preventing pregnancy  Patient has the following medical problems: does not have a problem list on file.  Chief Complaint  Patient presents with   Annual Exam    Iud check     Patient reports one female partner, practices vaginal sex, uses condoms rarely, has a hormonal IUD (Mirena ) in place since 01/09/22, and denies history of STI. She indicates last sex was 2 weeks ago (11/28/23) without a condom. She indicates her periods are irregular since having the IUD placed.   Last PAP 12/2021: NILM, Next due 12/2026.  Patient also has concerns of nausea X 2 days, urinary symptoms (pressure at end of urination, feeling of inability to empty bladder, malodorous urine), and spotting since her last sexual encounter. She indicates pain during the last sexual encounter and not feeling right since then.  Patient indicates headaches that are stabbing with pressure, last for about 1 minute and have occurred a few times. Indicates these started when she got the IUD placed and occur over the left temporal and parietal area of the head.   Patient denies fevers, chills, or malaise.   See flowsheet for other program required questions.   Diabetes screening This patient is 36 y.o. with a BMI of Body mass index is 20.98 kg/m. Is this patient eligible for diabetes screening based on BMI> 47 and age >35? No Was Hgb A1c ordered? not applicable  STI screening Patient reports 1 of partners in  last year.  Does this patient desire STI screening?  Yes  Hepatitis C screening Has patient been screened once for HCV in the past?  Patient unsure. No record found through [facility].   No results found for: HCVAB  Does the patient meet criteria for HCV testing? Yes  Criteria:  Since the last HCV result, does the patient have any of the following? - Current drug use - Have a partner with drug use - Has been incarcerated  Hepatitis B screening Does the patient meet criteria for HBV testing? Yes Criteria:  -Household, sexual or needle sharing contact with HBV -History of drug use -HIV positive -Those with known Hep C  Health Maintenance Due  Topic Date Due   HIV Screening  Never done   Hepatitis C Screening  Never done   DTaP/Tdap/Td (1 - Tdap) Never done   INFLUENZA VACCINE  Never done   COVID-19 Vaccine (3 - 2024-25 season) 08/09/2023    Review of Systems  Gastrointestinal:  Positive for nausea (See HPI.).  Genitourinary:  Positive for dysuria. Negative for flank pain, frequency, hematuria and urgency.       Reports feelings of inability to empty bladder and malodorous urine.  Menstrual spotting.  GU discomfort.  See HPI for details.   Neurological:  Positive for headaches (See HPI.).    The following portions of the patient's history were reviewed and updated as appropriate: allergies, current medications, past family history, past medical history, past social history, past surgical history and problem list. Problem list  updated.  Objective:   Vitals:   12/08/23 1415  BP: 111/73  Weight: 130 lb (59 kg)  Height: 5' 6 (1.676 m)    Physical Exam Vitals and nursing note reviewed.  Constitutional:      Appearance: Normal appearance.  HENT:     Head: Normocephalic.     Salivary Glands: Right salivary gland is not diffusely enlarged or tender. Left salivary gland is not diffusely enlarged or tender.     Mouth/Throat:     Lips: Pink. No lesions.     Mouth: Mucous  membranes are moist.     Tongue: No lesions. Tongue does not deviate from midline.     Pharynx: Oropharynx is clear. Uvula midline. No oropharyngeal exudate or posterior oropharyngeal erythema.     Tonsils: No tonsillar exudate.  Eyes:     General:        Right eye: No discharge.        Left eye: No discharge.  Neck:     Thyroid: No thyroid mass or thyroid tenderness.     Trachea: Trachea and phonation normal. No tracheal tenderness or tracheal deviation.  Cardiovascular:     Rate and Rhythm: Normal rate and regular rhythm.     Heart sounds: Normal heart sounds, S1 normal and S2 normal.  Pulmonary:     Effort: Pulmonary effort is normal.     Breath sounds: Normal breath sounds and air entry.  Chest:     Comments: CBE not performed. Next due 12/2024. Abdominal:     General: Abdomen is flat. Bowel sounds are normal.     Palpations: Abdomen is soft.     Tenderness: There is no right CVA tenderness, left CVA tenderness, guarding or rebound.  Genitourinary:    Exam position: Lithotomy position.     Pubic Area: No rash or pubic lice.      Tanner stage (genital): 5.     Labia:        Right: No rash, tenderness, lesion or injury.        Left: No rash, tenderness, lesion or injury.      Vagina: No signs of injury and foreign body. Vaginal discharge present. No erythema, tenderness, bleeding or lesions.     Cervix: Normal. No cervical motion tenderness, discharge, friability, lesion, erythema, cervical bleeding or eversion.     Uterus: Normal.      Adnexa: Right adnexa normal and left adnexa normal.     Comments: pH>4.5 Copious amount of thin, brown, malodorous discharge present within vaginal vault.  IUD strings visualized about 2-3cm from cervical os.  Lymphadenopathy:     Head:     Right side of head: No submental, submandibular, tonsillar, preauricular or posterior auricular adenopathy.     Left side of head: No submental, submandibular, tonsillar, preauricular or posterior  auricular adenopathy.     Cervical: No cervical adenopathy.     Right cervical: No superficial or posterior cervical adenopathy.    Left cervical: No superficial or posterior cervical adenopathy.     Upper Body:     Right upper body: No supraclavicular or axillary adenopathy.     Left upper body: No supraclavicular or axillary adenopathy.     Lower Body: No right inguinal adenopathy. No left inguinal adenopathy.  Skin:    General: Skin is warm and dry.     Findings: No rash.     Comments: Skin tone appropriate for ethnicity.   Neurological:     Mental Status: She is  alert and oriented to person, place, and time.  Psychiatric:        Attention and Perception: Attention normal.        Mood and Affect: Mood normal.        Speech: Speech normal.        Behavior: Behavior normal. Behavior is cooperative.        Thought Content: Thought content normal.     Assessment and Plan:  Kristen Galvan is a 37 y.o. female G2P1011 presenting to the [facility] Department for an yearly wellness and contraception visit  1. Family planning (Primary) Contraception counseling: Reviewed options based on patient desire and reproductive life plan. Patient is interested in IUD or IUS, which patient currently has in place.   Risks, benefits, and typical effectiveness rates were reviewed.  Questions were answered.  Written information was also given to the patient to review.    The patient will follow up in  1 years for surveillance.  The patient was told to call with any further questions, or with any concerns about this method of contraception.  Emphasized use of condoms 100% of the time for STI prevention.  Educated on ECP and assessed need for ECP. Patient was NOT offered ECP based on no unprotected sex.   2. Well woman exam ~For symptom of headache, encouraged patient to increase water intake as she currently drinks (1) 8oz bottle a few times per week. Also encouraged her to get at  least 8 hours of sleep per night and keep a regular sleep schedule. Provided sleep hygiene education including no caffeine late in the afternoon/evening, no electronic screens at least 1 hour before bed, maintaining the same bedtime and wake time. Also encouraged patient to add exercise to her daily routine and advised it could be walking for 30 minutes 5 times per week. Patient advised she could take pain relievers like ibuprofen  or acetaminophen  if not contraindicated for her sparingly for a short period as directed on label to help relieve symptoms. Patient verbalized understanding.   ~Patient's genitourinary symptoms could be attributed to a UTI. Patient strongly encouraged to seek further evaluation from a PCP, UC, or ED as this clinic is unable to perform indicated diagnostic testing. Patient verbalized understanding.  ~Unlikely genitourinary symptoms are caused by displaced IUD based on PE.  ~Symptoms could be related to an STI. Testing performed in office today. See Screening for Venereal Disease below.  ~Patient encouraged to establish care with a PCP and PCP list provided.   3. Screening for venereal disease Patient's genitourinary symptoms could be related to an STI. Testing performed as follows: - Chlamydia/Gonorrhea [place] [facility] - HBV Antigen/Antibody [facility] - HIV/HCV [place] [facility] - Syphilis Serology, [place] [facility] - WET PREP FOR TRICH, YEAST, CLUE  Wet Prep positive for BV and treated by RN per [facility].   Patient advised not to have sex in any way, not even with condoms, until testing has resulted. Patient verbalized understanding and agreed.   Return in about 1 year (around 12/07/2024) for annual well-woman exam.  No future appointments.  Kristen LITTIE Narrow, NP

## 2023-12-08 NOTE — Progress Notes (Signed)
 Pt is here for family planning visit.  Family planning packet reviewed and given to pt.  Wet prep results reviewed,  treatment required per standing orders. The patient was dispensed metronidazole  today. I provided counseling today regarding the medication. We discussed the medication, the side effects and when to call clinic. Patient given the opportunity to ask questions. Questions answered.  Condoms declined. Larraine JONELLE Northern, RN

## 2024-09-08 ENCOUNTER — Ambulatory Visit
Admission: EM | Admit: 2024-09-08 | Discharge: 2024-09-08 | Disposition: A | Discharge status: Home / Self Care | Attending: Physician Assistant | Admitting: Physician Assistant

## 2024-09-08 DIAGNOSIS — H9202 Otalgia, left ear: Secondary | ICD-10-CM | POA: Insufficient documentation

## 2024-09-08 DIAGNOSIS — J029 Acute pharyngitis, unspecified: Secondary | ICD-10-CM | POA: Diagnosis not present

## 2024-09-08 LAB — GROUP A STREP BY PCR: Group A Strep by PCR: NOT DETECTED

## 2024-09-08 NOTE — ED Provider Notes (Signed)
 [facility]    CSN: [id number] Arrival date & time: 09/08/24  1817      History   Chief Complaint Chief Complaint  Patient presents with   Otalgia   Sore Throat    HPI Kristen Galvan is a 38 y.o. female presenting for left sided earache, sore throat and swollen lymph nodes since yesterday.  No fever, cough, congestion, runny nose, difficulty swallowing.  Patient tried to clean her ear out and states symptoms seem to get a little worse after that.  She denies any hearing loss or drainage from the ear.  HPI  Past Medical History:  Diagnosis Date   History of pneumothorax 1999    There are no active problems to display for this patient.   Past Surgical History:  Procedure Laterality Date   APPENDECTOMY  12/09/2011   BREAST BIOPSY Left 02/13/2016   path pending   CESAREAN SECTION     LUNG SURGERY Left 12/08/1997    OB History     Gravida  2   Para  1   Term  1   Preterm      AB  1   Living  1      SAB      IAB      Ectopic      Multiple      Live Births               Home Medications    Prior to Admission medications   Medication Sig Start Date End Date Taking? Authorizing Provider  baclofen  (LIORESAL ) 10 MG tablet Take 1 tablet (10 mg total) by mouth 3 (three) times daily. Patient not taking: Reported on 12/08/2023 05/02/22   Rodriguez-Southworth, Sylvia, PA-C  meloxicam  (MOBIC ) 7.5 MG tablet Take 1 tablet (7.5 mg total) by mouth daily. Patient not taking: Reported on 12/08/2023 05/02/22   Rodriguez-Southworth, Kyra, PA-C    Family History Family History  Problem Relation Age of Onset   Hypertension Mother    Diabetes Father    Cancer Maternal Grandmother    Dementia Maternal Aunt    Cancer Maternal Aunt     Social History Social History   Tobacco Use   Smoking status: Former    Types: Cigars   Smokeless tobacco: Never  Vaping Use   Vaping status: Never Used  Substance Use Topics   Alcohol use: Yes     Alcohol/week: 1.0 standard drink of alcohol    Types: 1 Standard drinks or equivalent per week    Comment: Socially, last drink 2 nights ago   Drug use: Yes    Types: Marijuana    Comment: Last use this month     Allergies   Penicillins   Review of Systems Review of Systems  Constitutional:  Negative for chills, diaphoresis, fatigue and fever.  HENT:  Positive for ear pain and sore throat. Negative for congestion, rhinorrhea, sinus pressure and sinus pain.   Respiratory:  Negative for cough and shortness of breath.   Gastrointestinal:  Negative for abdominal pain, nausea and vomiting.  Musculoskeletal:  Negative for arthralgias and myalgias.  Skin:  Negative for rash.  Neurological:  Negative for weakness and headaches.  Hematological:  Positive for adenopathy.     Physical Exam Triage Vital Signs ED Triage Vitals  Encounter Vitals Group     BP      Girls Systolic BP Percentile      Girls Diastolic BP Percentile      Boys  Systolic BP Percentile      Boys Diastolic BP Percentile      Pulse      Resp      Temp      Temp src      SpO2      Weight      Height      Head Circumference      Peak Flow      Pain Score      Pain Loc      Pain Education      Exclude from Growth Chart    No data found.  Updated Vital Signs BP 106/72 (BP Location: Right Arm)   Pulse (!) 55   Temp 98.5 F (36.9 C) (Oral)   Resp 18   Wt 132 lb (59.9 kg)   SpO2 97%   BMI 21.31 kg/m    Physical Exam Vitals and nursing note reviewed.  Constitutional:      General: She is not in acute distress.    Appearance: Normal appearance. She is not ill-appearing or toxic-appearing.  HENT:     Head: Normocephalic and atraumatic.     Right Ear: Tympanic membrane, ear canal and external ear normal.     Left Ear: Tympanic membrane, ear canal and external ear normal.     Nose: Congestion present.     Mouth/Throat:     Mouth: Mucous membranes are moist.     Pharynx: Oropharynx is clear.  Posterior oropharyngeal erythema present.     Tonsils: 1+ on the left.  Eyes:     General: No scleral icterus.       Right eye: No discharge.        Left eye: No discharge.     Conjunctiva/sclera: Conjunctivae normal.  Cardiovascular:     Rate and Rhythm: Regular rhythm. Bradycardia present.     Heart sounds: Normal heart sounds.  Pulmonary:     Effort: Pulmonary effort is normal. No respiratory distress.     Breath sounds: Normal breath sounds.  Musculoskeletal:     Cervical back: Neck supple.  Lymphadenopathy:     Cervical: Cervical adenopathy (left anterior cervical) present.  Skin:    General: Skin is dry.  Neurological:     General: No focal deficit present.     Mental Status: She is alert. Mental status is at baseline.     Motor: No weakness.     Gait: Gait normal.  Psychiatric:        Mood and Affect: Mood normal.        Behavior: Behavior normal.      UC Treatments / Results  Labs (all labs ordered are listed, but only abnormal results are displayed) Labs Reviewed  GROUP A STREP BY PCR    EKG   Radiology No results found.  Procedures Procedures (including critical care time)  Medications Ordered in UC Medications - No data to display  Initial Impression / Assessment and Plan / UC Course  I have reviewed the triage vital signs and the nursing notes.  Pertinent labs & imaging results that were available during my care of the patient were reviewed by me and considered in my medical decision making (see chart for details).   38 year old female presents for sore throat and left-sided ear pain since yesterday.  No fever, cough or congestion.  On exam she has no evidence of ear infection and actually states pressing on the tragus and tugging on the ear improves her ear discomfort.  She does have significant nasal congestion and swollen nasal mucosal edema, erythema posterior pharynx, 1+ enlarged tonsil on the left and left anterior cervical lymphadenopathy  with tenderness.  PCR strep test performed. Negative.  Viral URI. Supportive care advised. Discussed OTC meds.    Final Clinical Impressions(s) / UC Diagnoses   Final diagnoses:  Viral pharyngitis  Left ear pain     Discharge Instructions      -Negative strep -You have a virus -You could develop cough and congestion. Take OTC meds  URI/COLD SYMPTOMS: Your exam today is consistent with a viral illness. Antibiotics are not indicated at this time. Use medications as directed, including cough syrup, nasal saline, and decongestants. Your symptoms should improve over the next few days and resolve within 7-10 days. Increase rest and fluids. F/u if symptoms worsen or predominate such as sore throat, ear pain, productive cough, shortness of breath, or if you develop high fevers or worsening fatigue over the next several days.       ED Prescriptions   None    PDMP not reviewed this encounter.   Arvis Jolan NOVAK, PA-C 09/08/24 [phone]

## 2024-09-08 NOTE — ED Triage Notes (Signed)
 Sx x 1 day  Left ear pain, patient tried to clean out her ear today  Sore throat

## 2024-09-08 NOTE — Discharge Instructions (Addendum)
-  Negative strep -You have a virus -You could develop cough and congestion. Take OTC meds  URI/COLD SYMPTOMS: Your exam today is consistent with a viral illness. Antibiotics are not indicated at this time. Use medications as directed, including cough syrup, nasal saline, and decongestants. Your symptoms should improve over the next few days and resolve within 7-10 days. Increase rest and fluids. F/u if symptoms worsen or predominate such as sore throat, ear pain, productive cough, shortness of breath, or if you develop high fevers or worsening fatigue over the next several days.
